# Patient Record
Sex: Male | Born: 1985 | Race: White | Hispanic: No | Marital: Single | Smoking: Current every day smoker
Health system: Southern US, Community
[De-identification: ages and names within clinical notes are randomized; demographics above are authoritative.]

## PROBLEM LIST (undated history)

## (undated) DIAGNOSIS — Z789 Other specified health status: Secondary | ICD-10-CM

## (undated) DIAGNOSIS — W3400XA Accidental discharge from unspecified firearms or gun, initial encounter: Secondary | ICD-10-CM

---
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
## 2004-11-13 ENCOUNTER — Emergency Department (HOSPITAL_COMMUNITY): Admission: EM | Admit: 2004-11-13 | Discharge: 2004-11-13 | Payer: Self-pay | Admitting: Family Medicine

## 2005-03-22 ENCOUNTER — Emergency Department (HOSPITAL_COMMUNITY): Admission: EM | Admit: 2005-03-22 | Discharge: 2005-03-22 | Payer: Self-pay | Admitting: *Deleted

## 2006-04-30 ENCOUNTER — Emergency Department (HOSPITAL_COMMUNITY): Admission: EM | Admit: 2006-04-30 | Discharge: 2006-05-01 | Payer: Self-pay | Admitting: Emergency Medicine

## 2006-12-20 ENCOUNTER — Emergency Department (HOSPITAL_COMMUNITY): Admission: EM | Admit: 2006-12-20 | Discharge: 2006-12-20 | Payer: Self-pay | Admitting: Emergency Medicine

## 2006-12-25 ENCOUNTER — Ambulatory Visit: Payer: Self-pay | Admitting: *Deleted

## 2006-12-25 ENCOUNTER — Emergency Department (HOSPITAL_COMMUNITY): Admission: EM | Admit: 2006-12-25 | Discharge: 2006-12-25 | Payer: Self-pay | Admitting: Emergency Medicine

## 2006-12-25 ENCOUNTER — Inpatient Hospital Stay (HOSPITAL_COMMUNITY): Admission: AD | Admit: 2006-12-25 | Discharge: 2006-12-29 | Payer: Self-pay | Admitting: *Deleted

## 2007-03-17 ENCOUNTER — Emergency Department (HOSPITAL_COMMUNITY): Admission: EM | Admit: 2007-03-17 | Discharge: 2007-03-17 | Payer: Self-pay | Admitting: Emergency Medicine

## 2007-05-15 ENCOUNTER — Emergency Department (HOSPITAL_COMMUNITY): Admission: EM | Admit: 2007-05-15 | Discharge: 2007-05-15 | Payer: Self-pay | Admitting: Emergency Medicine

## 2007-06-09 ENCOUNTER — Emergency Department (HOSPITAL_COMMUNITY): Admission: EM | Admit: 2007-06-09 | Discharge: 2007-06-09 | Payer: Self-pay | Admitting: Emergency Medicine

## 2007-09-04 ENCOUNTER — Emergency Department (HOSPITAL_COMMUNITY): Admission: EM | Admit: 2007-09-04 | Discharge: 2007-09-04 | Payer: Self-pay | Admitting: Emergency Medicine

## 2007-11-27 ENCOUNTER — Emergency Department (HOSPITAL_COMMUNITY): Admission: EM | Admit: 2007-11-27 | Discharge: 2007-11-27 | Payer: Self-pay | Admitting: Emergency Medicine

## 2008-01-04 ENCOUNTER — Emergency Department (HOSPITAL_COMMUNITY): Admission: EM | Admit: 2008-01-04 | Discharge: 2008-01-04 | Payer: Self-pay | Admitting: Emergency Medicine

## 2008-01-07 ENCOUNTER — Emergency Department (HOSPITAL_COMMUNITY): Admission: EM | Admit: 2008-01-07 | Discharge: 2008-01-07 | Payer: Self-pay | Admitting: Internal Medicine

## 2008-02-05 ENCOUNTER — Emergency Department (HOSPITAL_COMMUNITY): Admission: EM | Admit: 2008-02-05 | Discharge: 2008-02-05 | Payer: Self-pay | Admitting: Emergency Medicine

## 2008-03-03 ENCOUNTER — Emergency Department (HOSPITAL_COMMUNITY): Admission: EM | Admit: 2008-03-03 | Discharge: 2008-03-03 | Payer: Self-pay | Admitting: Emergency Medicine

## 2008-03-30 ENCOUNTER — Emergency Department (HOSPITAL_COMMUNITY): Admission: EM | Admit: 2008-03-30 | Discharge: 2008-03-30 | Payer: Self-pay | Admitting: Family Medicine

## 2009-01-26 ENCOUNTER — Emergency Department (HOSPITAL_COMMUNITY): Admission: EM | Admit: 2009-01-26 | Discharge: 2009-01-26 | Payer: Self-pay | Admitting: Family Medicine

## 2009-02-25 ENCOUNTER — Emergency Department (HOSPITAL_COMMUNITY): Admission: EM | Admit: 2009-02-25 | Discharge: 2009-02-25 | Payer: Self-pay | Admitting: Emergency Medicine

## 2011-03-04 NOTE — H&P (Signed)
Gregory Hooper, Gregory Hooper             ACCOUNT NO.:  [id number]   MEDICAL RECORD NO.:  [id number]          PATIENT TYPE:  IPS   LOCATION:  0407                          FACILITY:  [facility]   PHYSICIAN:  Jasmine Pang, M.D. DATE OF BIRTH:  05-28-1986   DATE OF ADMISSION:  12/25/2006  DATE OF DISCHARGE:                       PSYCHIATRIC ADMISSION ASSESSMENT   HISTORY OF PRESENT ILLNESS:  The patient is here on petition.  Paper  said the patient was pointing a gun to his head.  The patient states  that he wanted to kill himself. He states he has lost everything, his  girlfriend, his car, his job and his tools.  He has been smoking crack  cocaine.  He  has a history of cocaine use for approximately 4 years.  Was in a rehab program for approximately 2 months.  Relapsed about a  month later.  He also has been drinking, a drink primarily on the  weekends with an unspecified amount but states he has been drinking a  lot.  He has lost about 5-10 pounds after he was in the rehab program.  He is endorsing mood swings and racing thoughts.  He states he has a  bad temper and is stating he just does not want to be here although  he does promise safety on the unit.  He feels he is bipolar and he feels  he needs to be on medication.   PAST PSYCHIATRIC HISTORY:  This is his first admission to the [facility].  He is currently sponsored.  His longest history of  sobriety from cocaine has been one month.  Again was in a rehab program  in [place], [place] for approximately 2 months.   SOCIAL HISTORY:  He is a 25 year old single white male.  He lives with  his uncle and his mother. He has an eighth grade education.  Currently  unemployed.  Denies any legal problems.   FAMILY HISTORY:  Bipolar with his mother.  She is on medications.  The  patient is uncertain of what they are.   ALCOHOL AND DRUG HISTORY:  The patient smokes.  Alcohol and drinking  habits as described above.  He denies  any seizure activity.  Denies any  drinking in the morning or blackouts.   PRIMARY CARE Ilyana Manuele:  He has none.   MEDICAL PROBLEMS:  He denies any acute or chronic health issues.   MEDICATIONS:  No medications.   DRUG ALLERGIES:  No known allergies.   The patient was fully assessed at [facility] emergency department.   His temperature is 98.4, 105 heart rate, 20 respirations, blood pressure  132/84, 97% saturated.   LABORATORY DATA:  CBC within normal limits.  B-met within normal limits.  Alcohol level was 184.  Urine drug screen is positive for cocaine and  positive for THC.  He had a normal EKG.   MENTAL STATUS EXAM:  He is in the bed.  He is awake, staring ahead.  Currently wearing hospital scrubs.  SPEECH:  The patient initially provided only clipped yes or no answers.  Does not elaborate.  MOOD:  He is angry.  He is depressed.  The  patient's affect is flat and sad.  He does apologize for his attitude.  Thought processes are coherent.  No evidence of psychosis.  __________  intact.  Memory is good.  Judgment is fair.  Insight is fair.   AXIS I:  Rule out bipolar disorder, polysubstance abuse.  AXIS II:  Deferred.  AXIS III.  No acute or chronic health issues.  AXIS IV:  Problems with education, occupation, other psychosocial  problems.  AXIS V:  Current is 35.   PLAN:  Contract for safety.  We will stabilize mood and work on relapse  prevention.  Will have Librium available on a p.r.n. basis for  withdrawal symptoms.  Put nicotine patch on for tobacco use.  We will  initiate Depakote as mood stabilization and have Seroquel for  irritability agitation.  Will also get a liver function test and a TSH.  Consider a family session with his mother.  Case manager is to identify  any further rehab programs and follow-up.  Medication compliance will be  reinforced.  The patient is to increase his coping skills.  Tentative  length of stay is 4-5 days.      Landry Corporal,  N.P.      Jasmine Pang, M.D.  Electronically Signed    JO/MEDQ  D:  12/25/2006  T:  12/26/2006  Job:  [id number]

## 2011-03-04 NOTE — Discharge Summary (Signed)
Gregory Hooper, BAIL NO.:  [id number]   MEDICAL RECORD NO.:  [id number]          PATIENT TYPE:  IPS   LOCATION:  0407                          FACILITY:  [facility]   PHYSICIAN:  Jasmine Pang, M.D. DATE OF BIRTH:  Sep 25, 1986   DATE OF ADMISSION:  12/25/2006  DATE OF DISCHARGE:  12/29/2006                               DISCHARGE SUMMARY   IDENTIFICATION:  The patient is a 25 year old single white male who  lives with his uncle and his mother.  He was admitted on an involuntary  petition on December 25, 2006.   HISTORY OF PRESENT ILLNESS:  The petition paper said the patient was  pointing a gun to his head.  The patient states he wanted to kill  myself.  He states he has lost everything including his girlfriend, his  car, his job and his tools.  He has been smoking crack cocaine.  He has  had a history of cocaine use for approximately four years.  He was in a  rehab program for approximately two months.  He relapsed about a month  later.  He has been drinking a drink primarily on the weekends with an  unspecified amount but states he has been drinking a lot.  He has lost  about 5-10 pounds after he was in the rehab program.  He is endorsing  mood swings and racing thoughts.  He states he has a bad temper and is  stating he does not want to be here.  He does promise safety on the  unit, however.  He feels he is bipolar and needs to be on medication.  This is the first admission to the [facility] for this  patient.  He is currently sponsored by the mental health center.  His  longest history of sobriety from cocaine has been one month.  Again, he  was in rehab program in [place], [place] approximately two months  ago.  He has a family history of bipolar disorder (mother).  She is on  medications.  He has no acute or chronic health issues.  He is on no  medications.  He has no known drug allergies.   PHYSICAL EXAMINATION:  The patient was fully  assessed at [facility]  Emergency Department.  He was in no acute medical distress and was  physically healthy.   LABORATORY DATA:  CBC was within normal limits.  BMET was within normal  limits.  Alcohol level was 184.  Urine drug screen was positive for  cocaine and THC.  He had a normal EKG.  Hepatic profile was within  normal limits except for a slightly decreased total protein of 5.8.  TSH  was 1.989 which was within normal limits.   HOSPITAL COURSE:  Upon admission, the patient was placed on Librium 25  mg p.o. q.6h. p.r.n. withdrawal.  On December 25, 2006, the patient was  placed on a Nicoderm patch 21 mg as per smoking cessation protocol.  On  December 25, 2006, the patient was started on Depakote ER 250 mg p.o. now,  then Depakote ER 500 mg p.o.  q.h.s.  He was also started on Seroquel 50  mg p.o. q.6h. p.r.n. agitation.  On December 27, 2006, the patient was  started on Seroquel 50 mg p.o. b.i.d. and 100 mg p.o. q.h.s. as a  standing dose.  On December 28, 2006, an a.m. Depakote level was obtained  and was 32.5 (50-100).  Depakote ER was increased to 1000 mg p.o. q.h.s.  On December 28, 2006, due to daytime sedation from the Seroquel, it was  decreased to 25 mg p.o. b.i.d. and 100 mg p.o. q.h.s.  On December 29, 2006, due to continue daytime sedation, Seroquel was decreased to 150 mg  p.o. q.h.s.  On December 29, 2006, the patient was complaining of GERD-like  symptoms and was placed on Protonix 40 mg now, then p.o. q.d.  The  patient tolerated his medications well.  The only major side effect was  sedation during the day with the Seroquel and this was eventually all  changed to h.s.   On December 26, 2006, the patient discussed being here because he was going  to kill himself due to his girlfriend's problems.  He complained of  hearing voices and was requesting meds for this.  The p.r.n. Seroquel  was started.  Mood was anxious and he was somewhat irritable.  There was  no suicidal or homicidal  ideation.  He states he slept well and his  appetite was fair.  On December 27, 2006, sleep was good and appetite was  good.  The patient was not sure whether the Seroquel helps but he did  feel somewhat calmer on it.  The staff notes that he seems to do better  after getting p.r.n. dose of Seroquel.  Therefore, Seroquel 50 mg p.o.  b.i.d. and 100 mg p.o. q.h.s. was started on a standing basis.  He  stated that it had been hard for him to talk to people on the unit  because he is very shy.  He discussed living with his mother and his  uncle.  He states he gets along well with his uncle but not his mother.  He became more irritable with me as we talked.  Depakote ER 500 mg p.o.  q.h.s. was continued.  On December 28, 2006, Depakote ER was increased to  1000 mg p.o. q.h.s.  The patient stated he felt ready to go home soon.  He was still having a problem with his attitude flaring up.  He stated  he was craving nicotine and felt this was impacting on his irritability.  He was more pleasant today.  He wanted discharge tomorrow and it was  felt he would be ready for this.  He still wants a rehab treatment  program and will arrange this once he is back home with his mother and  uncle.   On December 29, 2006, mental status had improved markedly from admission.  The patient was less depressed.  The patient was cooperative, friendly  and conversant.  He had good eye contact.  Speech was normal rate and  flow.  Psychomotor activity was within normal limits.  Mood was  euthymic.  Affect wide range.  There was no suicidal or homicidal  ideation.  No thoughts of self-injurious behavior.  No auditory or  visual hallucinations.  No paranoia or delusions.  Thoughts were logical  and goal-directed.  Thought content no predominant theme.  Cognitive  exam was grossly within normal limits and back to baseline.  It was felt the patient was stable  enough to be discharged back to his mother and  uncle's home today.    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  Polysubstance abuse.  AXIS II:  None.  AXIS III:  No acute or chronic health problems.  AXIS IV:  Severe (problems with education, occupation, primary social  group, other psychosocial problems, burden of psychiatric illness).  AXIS V:  GAF upon discharge 50; GAF upon admission 35; GAF highest past  year 60-65.   ACTIVITY/DIET:  The patient had no specific activity level or dietary  restrictions.   POST-HOSPITAL CARE PLANS:  The patient will be seen at the [facility] by Dr. Lang Snow on Thursday, January 04, 2007 at 10 a.m.   DISCHARGE MEDICATIONS:  1. Depakote ER 1000 mg p.o. q.h.s.  2. Seroquel 150 mg p.o. q.h.s.  3. Protonix 40 mg, 1 p.o. q.d.   A.m. Depakote level on December 28, 2006 was 32.5 (50-100).  This was on  500 mg ER q.h.s.  The dose was increased to 1000 mg p.o. q.h.s.  afterwards.      Jasmine Pang, M.D.  Electronically Signed     BHS/MEDQ  D:  12/29/2006  T:  12/29/2006  Job:  [id number]

## 2011-03-04 NOTE — Op Note (Signed)
[facility]  Patient:    Gregory Hooper, Gregory Hooper Visit Number: [id number] MRN: [id number]          Service Type: DSU Location: [facility] Attending Physician:  Ronne Binning Dictated by:   Nicki Reaper, M.D. Proc. Date: 11/09/01 Admit Date:  11/09/2001                             Operative Report  PREOPERATIVE DIAGNOSIS:  Laceration left ring finger.  POSTOPERATIVE DIAGNOSIS:  Laceration left ring finger.  PROCEDURE:  Exploration and repair digital artery and nerve ulnar side, left ring finger.  Repair partial flexor tendon laceration, flexor sheath.  SURGEON:  Nicki Reaper, M.D.  ANESTHESIA:  General.  ANESTHESIOLOGIST:  Janetta Hora. Gelene Mink, M.D.  HISTORY:  The patient is a 25 year old male who suffered a laceration of his left ring finger.  He complains of numbness and tingling, pain with resistive flexion.  PROCEDURE:  The patient was brought to the operating room where a general anesthetic was carried out without difficulty.  He was prepped and draped using duraprep with the left arm free.  The limb was exsanguinated with an Esmarch bandage, tourniquet placed high and the arm was inflated to 250 mmHg. The wound was extended proximally and distally after removal of the sutures. The laceration to the flexor tendon sheath was identified along with the laceration to the digital artery and nerve on the ulnar side.  These were complete. The sheath was opened.  A less than 10% laceration in the superficialis tendon was identified.  The sheath was then closed after irrigation with a running 6-0 Mersilene suture.  The finger was placed through a full range of motion to be certain that there was no adherence to the tendons beneath the repair.  The tendons moved freely.  The operative microscope was then brought into position.  The ends of the artery cut back to normal intimi, irrigated, and dilated.  A repair was then performed with backwall  first technique with interrupted 9-0 nylon sutures. The ends of the nerve were freshened.  An epineural repair aligning vesicles as much as possible.  Again, the repair was preformed with interrupted 9-0 nylon sutures. The wound was irrigated.  The skin was closed with interrupted 5-0 nylon sutures.  Sterile compression dressing and splint was applied.  The patient tolerated the procedure well and was taken to the recovery room for observation in satisfactory condition.  It should be noted that the laceration was just distal to the web.  The patient tolerated the procedure well.  He was discharged home to return to the [facility] in 1 week on Vicodin and Keflex. Dictated by:   Nicki Reaper, M.D. Attending Physician:  Ronne Binning DD:  11/09/01 TD:  11/09/01 Job: [id number]

## 2011-07-11 LAB — CBC
Hemoglobin: 15.4
MCHC: 33.9
MCV: 93.3
RDW: 12.6

## 2011-07-11 LAB — BASIC METABOLIC PANEL
BUN: 7
CO2: 29
Calcium: 9.5
Chloride: 103
Creatinine, Ser: 0.88
GFR calc Af Amer: >60
GFR calc non Af Amer: >60
Glucose, Bld: 89
Potassium: 3.3 — ABNORMAL LOW
Sodium: 141

## 2011-07-11 LAB — DIFFERENTIAL
Basophils Absolute: 0
Basophils Relative: 0
Eosinophils Absolute: 0
Monocytes Absolute: 0.6
Neutro Abs: 6.6
Neutrophils Relative %: 72

## 2012-09-12 ENCOUNTER — Emergency Department (HOSPITAL_COMMUNITY): Payer: Self-pay

## 2012-09-12 ENCOUNTER — Emergency Department (HOSPITAL_COMMUNITY)
Admission: EM | Admit: 2012-09-12 | Discharge: 2012-09-12 | Disposition: A | Discharge status: Home / Self Care | Payer: Self-pay | Attending: Emergency Medicine | Admitting: Emergency Medicine

## 2012-09-12 ENCOUNTER — Encounter (HOSPITAL_COMMUNITY): Payer: Self-pay | Admitting: Emergency Medicine

## 2012-09-12 DIAGNOSIS — S46912A Strain of unspecified muscle, fascia and tendon at shoulder and upper arm level, left arm, initial encounter: Secondary | ICD-10-CM

## 2012-09-12 DIAGNOSIS — Y9289 Other specified places as the place of occurrence of the external cause: Secondary | ICD-10-CM | POA: Insufficient documentation

## 2012-09-12 DIAGNOSIS — Y9389 Activity, other specified: Secondary | ICD-10-CM | POA: Insufficient documentation

## 2012-09-12 DIAGNOSIS — IMO0002 Reserved for concepts with insufficient information to code with codable children: Secondary | ICD-10-CM | POA: Insufficient documentation

## 2012-09-12 DIAGNOSIS — F17200 Nicotine dependence, unspecified, uncomplicated: Secondary | ICD-10-CM | POA: Insufficient documentation

## 2012-09-12 DIAGNOSIS — X500XXA Overexertion from strenuous movement or load, initial encounter: Secondary | ICD-10-CM | POA: Insufficient documentation

## 2012-09-12 MED ORDER — HYDROCODONE-ACETAMINOPHEN 5-325 MG PO TABS: 1.0000 | ORAL_TABLET | ORAL | Status: DC | PRN

## 2012-09-12 MED ORDER — IBUPROFEN 800 MG PO TABS: 800.0000 mg | ORAL_TABLET | Freq: Three times a day (TID) | ORAL | Status: DC

## 2012-09-12 NOTE — ED Notes (Signed)
Patient states he was doing yard work the day before and woke up with left shoulder and arm pain.  Patient c/o pain with palpation.  Movement worsens the pain.

## 2012-09-12 NOTE — ED Provider Notes (Signed)
History     CSN: [id number]  Arrival date & time 09/12/12  2008   First MD Initiated Contact with Patient 09/12/12 2126      Chief Complaint  Patient presents with  . Shoulder Pain    (Consider location/radiation/quality/duration/timing/severity/associated sxs/prior treatment) Patient is a 26 y.o. male presenting with shoulder pain. The history is provided by the patient.  Shoulder Pain This is a new problem. The current episode started yesterday. The problem occurs constantly. Pertinent negatives include no chills or fever. Associated symptoms comments: He has had pain in the left shoulder since yardwork 2 days ago. No direct trauma. No neck pain.Marland Kitchen    No past medical history on file.  No past surgical history on file.  No family history on file.  History  Substance Use Topics  . Smoking status: Current Every Day Smoker -- 1.0 packs/day    Types: Cigarettes  . Smokeless tobacco: Not on file  . Alcohol Use: No      Review of Systems  Constitutional: Negative for fever and chills.  HENT: Negative.   Respiratory: Negative.   Cardiovascular: Negative.   Gastrointestinal: Negative.   Musculoskeletal: Negative.        See HPI  Skin: Negative.   Neurological: Negative.     Allergies  Review of patient's allergies indicates no known allergies.  Home Medications  No current outpatient prescriptions on file.  BP 134/76  Pulse 78  Temp 98.4 F (36.9 C)  Resp 18  Ht 5\' 5"  (1.651 m)  Wt 130 lb (58.968 kg)  BMI 21.63 kg/m2  SpO2 97%  Physical Exam  Constitutional: He is oriented to person, place, and time. He appears well-developed and well-nourished.  Neck: Normal range of motion.  Cardiovascular:       Pulses in distal UE's present and equal.   Pulmonary/Chest: Effort normal.  Musculoskeletal: Normal range of motion.       Left shoulder unremarkable in appearance. No swelling or bony deformity. FROM with pain on full extension of shoulder. 5/5 strength in  UE. No cervical or paracervical tenderness.   Neurological: He is alert and oriented to person, place, and time.  Skin: Skin is warm and dry. No erythema.  Psychiatric: He has a normal mood and affect.    ED Course  Procedures (including critical care time)  Labs Reviewed - No data to display Dg Shoulder Left  09/12/2012  *RADIOLOGY REPORT*  Clinical Data: Left shoulder pain.  LEFT SHOULDER - 2+ VIEW  Comparison: Chest x-ray 03/22/2005.  Findings: Three views of the left shoulder demonstrate no acute displaced fracture, subluxation or dislocation.  IMPRESSION: No acute abnormality of the left shoulder.   Original Report Authenticated By: Trudie Reed, M.D.      No diagnosis found.  1. Left shoulder strain  MDM  Uncomplicated shoulder strain.        Rodena Medin, PA-C 09/12/12 2314

## 2012-09-13 NOTE — ED Provider Notes (Signed)
Medical screening examination/treatment/procedure(s) were performed by non-physician practitioner and as supervising physician I was immediately available for consultation/collaboration.  Amaryllis Malmquist, MD 09/13/12 0103 

## 2013-02-10 ENCOUNTER — Inpatient Hospital Stay (HOSPITAL_COMMUNITY)
Admission: EM | Admit: 2013-02-10 | Discharge: 2013-02-14 | DRG: 604 | Disposition: A | Discharge status: Home / Self Care | Payer: No Typology Code available for payment source | Attending: General Surgery | Admitting: General Surgery

## 2013-02-10 ENCOUNTER — Other Ambulatory Visit: Payer: Self-pay

## 2013-02-10 ENCOUNTER — Emergency Department (HOSPITAL_COMMUNITY): Payer: No Typology Code available for payment source

## 2013-02-10 ENCOUNTER — Inpatient Hospital Stay (HOSPITAL_COMMUNITY): Payer: No Typology Code available for payment source

## 2013-02-10 DIAGNOSIS — S02400B Malar fracture unspecified, initial encounter for open fracture: Secondary | ICD-10-CM | POA: Diagnosis present

## 2013-02-10 DIAGNOSIS — S025XXA Fracture of tooth (traumatic), initial encounter for closed fracture: Secondary | ICD-10-CM

## 2013-02-10 DIAGNOSIS — F1410 Cocaine abuse, uncomplicated: Secondary | ICD-10-CM | POA: Diagnosis present

## 2013-02-10 DIAGNOSIS — T1490XA Injury, unspecified, initial encounter: Secondary | ICD-10-CM | POA: Diagnosis present

## 2013-02-10 DIAGNOSIS — S0180XA Unspecified open wound of other part of head, initial encounter: Principal | ICD-10-CM | POA: Diagnosis present

## 2013-02-10 DIAGNOSIS — Y230XXA Shotgun discharge, undetermined intent, initial encounter: Secondary | ICD-10-CM

## 2013-02-10 DIAGNOSIS — R000 Tachycardia, unspecified: Secondary | ICD-10-CM | POA: Diagnosis present

## 2013-02-10 DIAGNOSIS — S21109A Unspecified open wound of unspecified front wall of thorax without penetration into thoracic cavity, initial encounter: Secondary | ICD-10-CM

## 2013-02-10 DIAGNOSIS — S2190XA Unspecified open wound of unspecified part of thorax, initial encounter: Secondary | ICD-10-CM | POA: Diagnosis present

## 2013-02-10 DIAGNOSIS — S0183XA Puncture wound without foreign body of other part of head, initial encounter: Secondary | ICD-10-CM

## 2013-02-10 DIAGNOSIS — D62 Acute posthemorrhagic anemia: Secondary | ICD-10-CM | POA: Diagnosis present

## 2013-02-10 DIAGNOSIS — R0902 Hypoxemia: Secondary | ICD-10-CM | POA: Diagnosis present

## 2013-02-10 DIAGNOSIS — S0242XB Fracture of alveolus of maxilla, initial encounter for open fracture: Secondary | ICD-10-CM

## 2013-02-10 DIAGNOSIS — S02401B Maxillary fracture, unspecified, initial encounter for open fracture: Secondary | ICD-10-CM | POA: Diagnosis present

## 2013-02-10 DIAGNOSIS — W3400XA Accidental discharge from unspecified firearms or gun, initial encounter: Secondary | ICD-10-CM

## 2013-02-10 DIAGNOSIS — S21339A Puncture wound without foreign body of unspecified front wall of thorax with penetration into thoracic cavity, initial encounter: Secondary | ICD-10-CM

## 2013-02-10 DIAGNOSIS — S272XXA Traumatic hemopneumothorax, initial encounter: Secondary | ICD-10-CM

## 2013-02-10 HISTORY — DX: Other specified health status: Z78.9

## 2013-02-10 LAB — COMPREHENSIVE METABOLIC PANEL
ALT: 18 U/L (ref 0–53)
AST: 16 U/L (ref 0–37)
Albumin: 2.8 g/dL — ABNORMAL LOW (ref 3.5–5.2)
Alkaline Phosphatase: 48 U/L (ref 39–117)
BUN: 7 mg/dL (ref 6–23)
Potassium: 3.3 mEq/L — ABNORMAL LOW (ref 3.5–5.1)
Sodium: 144 mEq/L (ref 135–145)
Total Protein: 5.1 g/dL — ABNORMAL LOW (ref 6.0–8.3)

## 2013-02-10 LAB — BASIC METABOLIC PANEL
Calcium: 7 mg/dL — ABNORMAL LOW (ref 8.4–10.5)
GFR calc Af Amer: >90 mL/min (ref >90)
GFR calc non Af Amer: >90 mL/min (ref >90)
Glucose, Bld: 167 mg/dL — ABNORMAL HIGH (ref 70–99)
Potassium: 4.8 mEq/L (ref 3.5–5.1)
Sodium: 144 mEq/L (ref 135–145)

## 2013-02-10 LAB — CG4 I-STAT (LACTIC ACID): Lactic Acid, Venous: 4.48 mmol/L — ABNORMAL HIGH (ref 0.5–2.2)

## 2013-02-10 LAB — URINALYSIS, MICROSCOPIC ONLY
Glucose, UA: NEGATIVE mg/dL
Hgb urine dipstick: NEGATIVE
Ketones, ur: NEGATIVE mg/dL
Leukocytes, UA: NEGATIVE
pH: 5 (ref 5.0–8.0)

## 2013-02-10 LAB — CBC
Hemoglobin: 7.8 g/dL — ABNORMAL LOW (ref 13.0–17.0)
MCH: 32 pg (ref 26.0–34.0)
MCHC: 35.8 g/dL (ref 30.0–36.0)
MCHC: 35.2 g/dL (ref 30.0–36.0)
MCHC: 35.5 g/dL (ref 30.0–36.0)
Platelets: 184 10*3/uL (ref 150–400)
Platelets: 167 10*3/uL (ref 150–400)
RBC: 2.48 MIL/uL — ABNORMAL LOW (ref 4.22–5.81)
RDW: 12.6 % (ref 11.5–15.5)
RDW: 12.7 % (ref 11.5–15.5)
WBC: 23.4 10*3/uL — ABNORMAL HIGH (ref 4.0–10.5)

## 2013-02-10 LAB — CBC WITH DIFFERENTIAL/PLATELET
Basophils Absolute: 0 10*3/uL (ref 0.0–0.1)
Eosinophils Absolute: 0.1 10*3/uL (ref 0.0–0.7)
Eosinophils Relative: 1 % (ref 0–5)
Lymphocytes Relative: 36 % (ref 12–46)
MCH: 31.7 pg (ref 26.0–34.0)
MCV: 91.2 fL (ref 78.0–100.0)
Platelets: 250 10*3/uL (ref 150–400)
RDW: 12.4 % (ref 11.5–15.5)
WBC: 6.4 10*3/uL (ref 4.0–10.5)

## 2013-02-10 LAB — ABO/RH: ABO/RH(D): A POS

## 2013-02-10 LAB — URINALYSIS, ROUTINE W REFLEX MICROSCOPIC
Ketones, ur: NEGATIVE mg/dL
Leukocytes, UA: NEGATIVE
Nitrite: NEGATIVE
Protein, ur: NEGATIVE mg/dL
Urobilinogen, UA: 0.2 mg/dL (ref 0.0–1.0)
pH: 5 (ref 5.0–8.0)

## 2013-02-10 LAB — GLUCOSE, CAPILLARY
Glucose-Capillary: 141 mg/dL — ABNORMAL HIGH (ref 70–99)
Glucose-Capillary: 158 mg/dL — ABNORMAL HIGH (ref 70–99)

## 2013-02-10 LAB — PROTIME-INR
INR: 1.39 (ref 0.00–1.49)
Prothrombin Time: 16.7 seconds — ABNORMAL HIGH (ref 11.6–15.2)

## 2013-02-10 LAB — POCT I-STAT, CHEM 8
Calcium, Ion: 1.06 mmol/L — ABNORMAL LOW (ref 1.12–1.23)
HCT: 33 % — ABNORMAL LOW (ref 39.0–52.0)
Hemoglobin: 11.2 g/dL — ABNORMAL LOW (ref 13.0–17.0)
Sodium: 148 mEq/L — ABNORMAL HIGH (ref 135–145)
TCO2: 20 mmol/L (ref 0–100)

## 2013-02-10 LAB — CDS SEROLOGY

## 2013-02-10 LAB — MRSA PCR SCREENING: MRSA by PCR: NEGATIVE

## 2013-02-10 LAB — URINE MICROSCOPIC-ADD ON

## 2013-02-10 MED ORDER — ONDANSETRON HCL 4 MG PO TABS
4.0000 mg | ORAL_TABLET | Freq: Four times a day (QID) | ORAL | Status: DC | PRN
  Administered 2013-02-11 – 2013-02-12 (×3): 4 mg via ORAL
  Filled 2013-02-10 (×4): qty 1

## 2013-02-10 MED ORDER — HYDROMORPHONE HCL PF 2 MG/ML IJ SOLN
4.0000 mg | Freq: Once | INTRAMUSCULAR | Status: AC
  Administered 2013-02-10: 4 mg via INTRAVENOUS

## 2013-02-10 MED ORDER — FENTANYL CITRATE 0.05 MG/ML IJ SOLN
100.0000 ug | Freq: Once | INTRAMUSCULAR | Status: AC
  Administered 2013-02-10: 100 ug via INTRAVENOUS

## 2013-02-10 MED ORDER — DIPHENHYDRAMINE HCL 50 MG/ML IJ SOLN
12.5000 mg | Freq: Four times a day (QID) | INTRAMUSCULAR | Status: DC | PRN
  Administered 2013-02-10: 12.5 mg via INTRAVENOUS
  Filled 2013-02-10: qty 1

## 2013-02-10 MED ORDER — HYDROMORPHONE HCL PF 1 MG/ML IJ SOLN
1.0000 mg | Freq: Once | INTRAMUSCULAR | Status: AC
  Administered 2013-02-10: 1 mg via INTRAVENOUS
  Filled 2013-02-10: qty 1

## 2013-02-10 MED ORDER — SODIUM CHLORIDE 0.9 % IJ SOLN: 9.0000 mL | INTRAMUSCULAR | Status: DC | PRN

## 2013-02-10 MED ORDER — PANTOPRAZOLE SODIUM 40 MG IV SOLR
40.0000 mg | Freq: Every day | INTRAVENOUS | Status: DC
  Administered 2013-02-10 – 2013-02-12 (×2): 40 mg via INTRAVENOUS
  Filled 2013-02-10 (×4): qty 40

## 2013-02-10 MED ORDER — WHITE PETROLATUM GEL
Status: AC
  Administered 2013-02-10: 09:00:00
  Filled 2013-02-10: qty 5

## 2013-02-10 MED ORDER — NALOXONE HCL 0.4 MG/ML IJ SOLN: 0.4000 mg | INTRAMUSCULAR | Status: DC | PRN

## 2013-02-10 MED ORDER — HYDROMORPHONE HCL PF 1 MG/ML IJ SOLN
1.0000 mg | INTRAMUSCULAR | Status: DC | PRN
  Administered 2013-02-10: 1 mg via INTRAVENOUS
  Filled 2013-02-10: qty 1

## 2013-02-10 MED ORDER — TETANUS-DIPHTH-ACELL PERTUSSIS 5-2.5-18.5 LF-MCG/0.5 IM SUSP
0.5000 mL | Freq: Once | INTRAMUSCULAR | Status: AC
  Administered 2013-02-10: 0.5 mL via INTRAMUSCULAR

## 2013-02-10 MED ORDER — ONDANSETRON HCL 4 MG/2ML IJ SOLN
4.0000 mg | Freq: Four times a day (QID) | INTRAMUSCULAR | Status: DC | PRN
  Administered 2013-02-11: 4 mg via INTRAVENOUS

## 2013-02-10 MED ORDER — IOHEXOL 350 MG/ML SOLN
100.0000 mL | Freq: Once | INTRAVENOUS | Status: AC | PRN
  Administered 2013-02-10: 100 mL via INTRAVENOUS

## 2013-02-10 MED ORDER — IPRATROPIUM-ALBUTEROL 20-100 MCG/ACT IN AERS
2.0000 | INHALATION_SPRAY | Freq: Four times a day (QID) | RESPIRATORY_TRACT | Status: DC
  Administered 2013-02-10 – 2013-02-11 (×6): 2 via RESPIRATORY_TRACT
  Filled 2013-02-10: qty 4

## 2013-02-10 MED ORDER — HYDROMORPHONE 0.3 MG/ML IV SOLN
INTRAVENOUS | Status: DC
  Administered 2013-02-10: 0.3 mg via INTRAVENOUS
  Administered 2013-02-10: 18:00:00 via INTRAVENOUS
  Administered 2013-02-11: 3 mg via INTRAVENOUS
  Administered 2013-02-11: 3.6 mg via INTRAVENOUS
  Administered 2013-02-11: 17:00:00 via INTRAVENOUS
  Administered 2013-02-11: 0.3 mg via INTRAVENOUS
  Administered 2013-02-12: 05:00:00 via INTRAVENOUS
  Administered 2013-02-12: 2.7 mg via INTRAVENOUS
  Administered 2013-02-12: 0.9 mg via INTRAVENOUS
  Filled 2013-02-10 (×5): qty 25

## 2013-02-10 MED ORDER — HYDROMORPHONE HCL PF 2 MG/ML IJ SOLN
INTRAMUSCULAR | Status: AC
  Filled 2013-02-10: qty 2

## 2013-02-10 MED ORDER — BIOTENE DRY MOUTH MT LIQD
15.0000 mL | Freq: Three times a day (TID) | OROMUCOSAL | Status: DC
  Administered 2013-02-10 – 2013-02-14 (×11): 15 mL via OROMUCOSAL

## 2013-02-10 MED ORDER — PANTOPRAZOLE SODIUM 40 MG PO TBEC
40.0000 mg | DELAYED_RELEASE_TABLET | Freq: Every day | ORAL | Status: DC
  Administered 2013-02-11: 40 mg via ORAL
  Filled 2013-02-10 (×2): qty 1

## 2013-02-10 MED ORDER — DEXTROSE IN LACTATED RINGERS 5 % IV SOLN
INTRAVENOUS | Status: DC
  Administered 2013-02-10 – 2013-02-12 (×7): via INTRAVENOUS

## 2013-02-10 MED ORDER — ONDANSETRON HCL 4 MG/2ML IJ SOLN
4.0000 mg | Freq: Four times a day (QID) | INTRAMUSCULAR | Status: DC | PRN
  Administered 2013-02-10: 4 mg via INTRAVENOUS
  Filled 2013-02-10: qty 2

## 2013-02-10 MED ORDER — DIPHENHYDRAMINE HCL 12.5 MG/5ML PO ELIX
12.5000 mg | ORAL_SOLUTION | Freq: Four times a day (QID) | ORAL | Status: DC | PRN
  Filled 2013-02-10: qty 5

## 2013-02-10 MED ORDER — CEFAZOLIN SODIUM 1-5 GM-% IV SOLN
1.0000 g | Freq: Once | INTRAVENOUS | Status: AC
  Administered 2013-02-10: 1 g via INTRAVENOUS
  Filled 2013-02-10: qty 50
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
## 2013-02-10 NOTE — ED Notes (Signed)
Blood and OR consent signed and witnessed by Brantley Persons

## 2013-02-10 NOTE — ED Notes (Signed)
To CT
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
## 2013-02-10 NOTE — ED Notes (Signed)
Pt continues to be alert and oriented x's 3.
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
## 2013-02-10 NOTE — ED Notes (Signed)
Returned from CT, tolerated scan without any problems.  Pt continues to c/o back pain.  Pt remains alert and oriented x's 3.

## 2013-02-10 NOTE — ED Notes (Signed)
PT clothing bagged/labeled and given to [facility]

## 2013-02-10 NOTE — ED Notes (Signed)
Per patient's request, phoned mother Lynden Oxford @ [phone]) and uncle Jolaine Click @ [phone]). Both are on the way.
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
## 2013-02-11 ENCOUNTER — Inpatient Hospital Stay (HOSPITAL_COMMUNITY): Payer: No Typology Code available for payment source

## 2013-02-11 DIAGNOSIS — D62 Acute posthemorrhagic anemia: Secondary | ICD-10-CM

## 2013-02-11 DIAGNOSIS — S271XXA Traumatic hemothorax, initial encounter: Secondary | ICD-10-CM

## 2013-02-11 LAB — CBC
HCT: 18.9 % — ABNORMAL LOW (ref 39.0–52.0)
MCH: 31.3 pg (ref 26.0–34.0)
MCHC: 36 g/dL (ref 30.0–36.0)
MCV: 88.7 fL (ref 78.0–100.0)
Platelets: 133 10*3/uL — ABNORMAL LOW (ref 150–400)
Platelets: 146 10*3/uL — ABNORMAL LOW (ref 150–400)
RBC: 2.13 MIL/uL — ABNORMAL LOW (ref 4.22–5.81)
RBC: 3.16 MIL/uL — ABNORMAL LOW (ref 4.22–5.81)
RDW: 12.6 % (ref 11.5–15.5)
WBC: 12.4 10*3/uL — ABNORMAL HIGH (ref 4.0–10.5)

## 2013-02-11 LAB — URINE CULTURE

## 2013-02-11 LAB — PREPARE RBC (CROSSMATCH)

## 2013-02-11 LAB — BASIC METABOLIC PANEL
CO2: 30 mEq/L (ref 19–32)
Chloride: 103 mEq/L (ref 96–112)
Creatinine, Ser: 0.74 mg/dL (ref 0.50–1.35)
GFR calc Af Amer: >90 mL/min (ref >90)
Potassium: 3.5 mEq/L (ref 3.5–5.1)

## 2013-02-11 MED ORDER — CEFAZOLIN SODIUM 1-5 GM-% IV SOLN
1.0000 g | Freq: Three times a day (TID) | INTRAVENOUS | Status: DC
  Administered 2013-02-11 – 2013-02-13 (×6): 1 g via INTRAVENOUS
  Filled 2013-02-11 (×9): qty 50
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
## 2013-02-11 NOTE — Progress Notes (Signed)
CRITICAL VALUE ALERT  Critical value received:  Hgb 6.7  Date of notification:  02/11/2013  Time of notification:  0316  Critical value read back:yes  Nurse who received alert:  Gerre Pebbles, RN/Chris Madilyn Fireman  MD notified (1st page):  Dr Janee Morn  Time of first page:  0320  MD notified (2nd page):  Time of second page:  Responding MD:  Dr Janee Morn  Time MD responded:  [phone]

## 2013-02-11 NOTE — Progress Notes (Signed)
UR completed 

## 2013-02-12 ENCOUNTER — Encounter (HOSPITAL_COMMUNITY): Payer: Self-pay | Admitting: *Deleted

## 2013-02-12 ENCOUNTER — Inpatient Hospital Stay (HOSPITAL_COMMUNITY): Payer: No Typology Code available for payment source

## 2013-02-12 DIAGNOSIS — S0183XA Puncture wound without foreign body of other part of head, initial encounter: Secondary | ICD-10-CM

## 2013-02-12 DIAGNOSIS — S21339A Puncture wound without foreign body of unspecified front wall of thorax with penetration into thoracic cavity, initial encounter: Secondary | ICD-10-CM

## 2013-02-12 DIAGNOSIS — S272XXA Traumatic hemopneumothorax, initial encounter: Secondary | ICD-10-CM

## 2013-02-12 DIAGNOSIS — S025XXA Fracture of tooth (traumatic), initial encounter for closed fracture: Secondary | ICD-10-CM

## 2013-02-12 DIAGNOSIS — D62 Acute posthemorrhagic anemia: Secondary | ICD-10-CM | POA: Diagnosis present

## 2013-02-12 DIAGNOSIS — S0242XB Fracture of alveolus of maxilla, initial encounter for open fracture: Secondary | ICD-10-CM

## 2013-02-12 DIAGNOSIS — W3400XA Accidental discharge from unspecified firearms or gun, initial encounter: Secondary | ICD-10-CM

## 2013-02-12 LAB — BASIC METABOLIC PANEL
GFR calc Af Amer: >90 mL/min (ref >90)
GFR calc non Af Amer: >90 mL/min (ref >90)
Potassium: 3.5 mEq/L (ref 3.5–5.1)
Sodium: 136 mEq/L (ref 135–145)

## 2013-02-12 LAB — DIFFERENTIAL
Basophils Absolute: 0 10*3/uL (ref 0.0–0.1)
Lymphocytes Relative: 15 % (ref 12–46)
Neutro Abs: 9.2 10*3/uL — ABNORMAL HIGH (ref 1.7–7.7)
Neutrophils Relative %: 74 % (ref 43–77)

## 2013-02-12 LAB — TYPE AND SCREEN
Antibody Screen: NEGATIVE
Unit division: 0
Unit division: 0

## 2013-02-12 LAB — CBC
Hemoglobin: 9.4 g/dL — ABNORMAL LOW (ref 13.0–17.0)
MCHC: 35.9 g/dL (ref 30.0–36.0)
RDW: 13.2 % (ref 11.5–15.5)

## 2013-02-12 LAB — HEMOGLOBIN AND HEMATOCRIT, BLOOD: Hemoglobin: 9.6 g/dL — ABNORMAL LOW (ref 13.0–17.0)

## 2013-02-12 MED ORDER — POTASSIUM CHLORIDE 10 MEQ/100ML IV SOLN: 10.0000 meq | INTRAVENOUS | Status: DC

## 2013-02-12 MED ORDER — OXYCODONE HCL 5 MG PO TABS
5.0000 mg | ORAL_TABLET | ORAL | Status: DC | PRN
  Administered 2013-02-12: 10 mg via ORAL
  Administered 2013-02-12: 5 mg via ORAL
  Administered 2013-02-12: 15 mg via ORAL
  Administered 2013-02-12: 5 mg via ORAL
  Administered 2013-02-12: 10 mg via ORAL
  Administered 2013-02-13: 15 mg via ORAL
  Administered 2013-02-13: 10 mg via ORAL
  Administered 2013-02-13: 15 mg via ORAL
  Administered 2013-02-13: 10 mg via ORAL
  Administered 2013-02-14: 15 mg via ORAL
  Filled 2013-02-12: qty 15
  Filled 2013-02-12: qty 1
  Filled 2013-02-12: qty 2
  Filled 2013-02-12: qty 3
  Filled 2013-02-12: qty 1
  Filled 2013-02-12: qty 2
  Filled 2013-02-12: qty 3
  Filled 2013-02-12: qty 2
  Filled 2013-02-12: qty 3
  Filled 2013-02-12: qty 2

## 2013-02-12 MED ORDER — POLYETHYLENE GLYCOL 3350 17 G PO PACK
17.0000 g | PACK | Freq: Every day | ORAL | Status: DC
  Administered 2013-02-12 – 2013-02-14 (×3): 17 g via ORAL
  Filled 2013-02-12 (×4): qty 1

## 2013-02-12 MED ORDER — HYDROMORPHONE HCL PF 1 MG/ML IJ SOLN
0.5000 mg | INTRAMUSCULAR | Status: DC | PRN
  Administered 2013-02-12 – 2013-02-14 (×6): 1 mg via INTRAVENOUS
  Filled 2013-02-12 (×6): qty 1

## 2013-02-12 MED ORDER — IPRATROPIUM-ALBUTEROL 20-100 MCG/ACT IN AERS
2.0000 | INHALATION_SPRAY | Freq: Two times a day (BID) | RESPIRATORY_TRACT | Status: DC
  Administered 2013-02-12 – 2013-02-13 (×4): 2 via RESPIRATORY_TRACT
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
## 2013-02-13 ENCOUNTER — Inpatient Hospital Stay (HOSPITAL_COMMUNITY): Payer: No Typology Code available for payment source

## 2013-02-13 LAB — BASIC METABOLIC PANEL
CO2: 30 mEq/L (ref 19–32)
Calcium: 8.4 mg/dL (ref 8.4–10.5)
Chloride: 102 mEq/L (ref 96–112)
GFR calc Af Amer: >90 mL/min (ref >90)
Sodium: 138 mEq/L (ref 135–145)

## 2013-02-13 LAB — CBC
Platelets: 177 10*3/uL (ref 150–400)
RBC: 2.91 MIL/uL — ABNORMAL LOW (ref 4.22–5.81)
WBC: 7.6 10*3/uL (ref 4.0–10.5)

## 2013-02-13 MED ORDER — NAPROXEN 500 MG PO TABS
500.0000 mg | ORAL_TABLET | Freq: Two times a day (BID) | ORAL | Status: DC
  Administered 2013-02-13 – 2013-02-14 (×2): 500 mg via ORAL
  Filled 2013-02-13 (×4): qty 1

## 2013-02-13 MED ORDER — ENOXAPARIN SODIUM 40 MG/0.4ML ~~LOC~~ SOLN
40.0000 mg | SUBCUTANEOUS | Status: DC
  Administered 2013-02-13 – 2013-02-14 (×2): 40 mg via SUBCUTANEOUS
  Filled 2013-02-13 (×2): qty 0.4

## 2013-02-13 MED ORDER — DOCUSATE SODIUM 100 MG PO CAPS
100.0000 mg | ORAL_CAPSULE | Freq: Two times a day (BID) | ORAL | Status: DC
  Administered 2013-02-13 – 2013-02-14 (×3): 100 mg via ORAL
  Filled 2013-02-13 (×3): qty 1
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
## 2013-02-13 NOTE — Progress Notes (Signed)
Clinical Social Work Department BRIEF PSYCHOSOCIAL ASSESSMENT 02/13/2013  Patient:  Gregory Hooper, Gregory Hooper     Account Number:  [id number]     Admit date:  02/10/2013  Clinical Social Worker:  Illene Silver  Date/Time:  02/12/2013 08:00 PM  Referred by:  Physician  Date Referred:  02/12/2013 Referred for  Psychosocial assessment   Other Referral:   Interview type:  Patient Other interview type:    PSYCHOSOCIAL DATA Living Status:  ALONE Admitted from facility:   Level of care:   Primary support name:  terry bean Primary support relationship to patient:  PARENT Degree of support available:   Pt reports that he will be d/c'ing home with mom and that she can provide 24 hour care.    CURRENT CONCERNS Current Concerns  Adjustment to Illness   Other Concerns:    SOCIAL WORK ASSESSMENT / PLAN CSW explained role of social work/discharge planning.  CSW will continue to follow pt should any d/c planning needs arise.   Assessment/plan status:  Psychosocial Support/Ongoing Assessment of Needs Other assessment/ plan:   Information/referral to community resources:    PATIENT'S/FAMILY'S RESPONSE TO PLAN OF CARE: Pt preoccupied with chest tube. Remarked that he was in a lot of pain.  Requested SBIRT done at a later time.

## 2013-02-13 NOTE — Progress Notes (Signed)
No air leak.  Hopefully output will be down enough to remove CT in 24 hours or so.  This patient has been seen and I agree with the findings and treatment plan.  Marta Lamas. Gae Bon, MD, FACS [phone] (pager) [phone] (direct pager) Trauma Surgeon
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
## 2013-02-14 ENCOUNTER — Inpatient Hospital Stay (HOSPITAL_COMMUNITY): Payer: No Typology Code available for payment source

## 2013-02-14 MED ORDER — OXYCODONE HCL 5 MG PO TABS
10.0000 mg | ORAL_TABLET | ORAL | Status: DC | PRN
  Administered 2013-02-14: 15 mg via ORAL
  Filled 2013-02-14: qty 2
  Filled 2013-02-14: qty 1

## 2013-02-14 MED ORDER — NAPROXEN 500 MG PO TABS: 500.0000 mg | ORAL_TABLET | Freq: Two times a day (BID) | ORAL | Status: DC

## 2013-02-14 MED ORDER — TRAMADOL HCL 50 MG PO TABS: 100.0000 mg | ORAL_TABLET | Freq: Four times a day (QID) | ORAL | Status: DC

## 2013-02-14 MED ORDER — TRAMADOL HCL 50 MG PO TABS
100.0000 mg | ORAL_TABLET | Freq: Four times a day (QID) | ORAL | Status: DC
  Administered 2013-02-14 (×2): 100 mg via ORAL
  Filled 2013-02-14 (×2): qty 2

## 2013-02-14 MED ORDER — OXYCODONE-ACETAMINOPHEN 10-325 MG PO TABS: 1.0000 | ORAL_TABLET | ORAL | Status: DC | PRN
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
## 2013-02-14 NOTE — Progress Notes (Signed)
Agree with removing CT.  Home later today seems appropriate.  This patient has been seen and I agree with the findings and treatment plan.  Marta Lamas. Gae Bon, MD, FACS [phone] (pager) [phone] (direct pager) Trauma Surgeon

## 2013-02-14 NOTE — Progress Notes (Signed)
Discharged to home

## 2013-02-15 ENCOUNTER — Telehealth (HOSPITAL_COMMUNITY): Payer: Self-pay | Admitting: Emergency Medicine

## 2013-02-18 ENCOUNTER — Encounter (HOSPITAL_COMMUNITY): Payer: Self-pay | Admitting: Emergency Medicine
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
## 2013-02-19 NOTE — Telephone Encounter (Signed)
Never received return phone call. 

## 2013-02-22 ENCOUNTER — Emergency Department (HOSPITAL_COMMUNITY)
Admission: EM | Admit: 2013-02-22 | Discharge: 2013-02-22 | Disposition: A | Discharge status: Home / Self Care | Payer: No Typology Code available for payment source | Attending: Emergency Medicine | Admitting: Emergency Medicine

## 2013-02-22 ENCOUNTER — Emergency Department (HOSPITAL_COMMUNITY): Payer: No Typology Code available for payment source

## 2013-02-22 ENCOUNTER — Encounter (HOSPITAL_COMMUNITY): Payer: Self-pay

## 2013-02-22 DIAGNOSIS — Z87828 Personal history of other (healed) physical injury and trauma: Secondary | ICD-10-CM | POA: Insufficient documentation

## 2013-02-22 DIAGNOSIS — R071 Chest pain on breathing: Secondary | ICD-10-CM | POA: Insufficient documentation

## 2013-02-22 DIAGNOSIS — R0789 Other chest pain: Secondary | ICD-10-CM

## 2013-02-22 DIAGNOSIS — F17200 Nicotine dependence, unspecified, uncomplicated: Secondary | ICD-10-CM | POA: Diagnosis not present

## 2013-02-22 DIAGNOSIS — G8918 Other acute postprocedural pain: Secondary | ICD-10-CM | POA: Insufficient documentation

## 2013-02-22 DIAGNOSIS — R079 Chest pain, unspecified: Secondary | ICD-10-CM | POA: Diagnosis present

## 2013-02-22 HISTORY — DX: Accidental discharge from unspecified firearms or gun, initial encounter: W34.00XA

## 2013-02-22 MED ORDER — KETOROLAC TROMETHAMINE 30 MG/ML IJ SOLN
30.0000 mg | Freq: Once | INTRAMUSCULAR | Status: AC
  Administered 2013-02-22: 30 mg via INTRAMUSCULAR
  Filled 2013-02-22: qty 1

## 2013-02-22 MED ORDER — HYDROMORPHONE HCL PF 1 MG/ML IJ SOLN
1.0000 mg | Freq: Once | INTRAMUSCULAR | Status: AC
  Administered 2013-02-22: 1 mg via INTRAMUSCULAR
  Filled 2013-02-22: qty 1

## 2013-02-22 NOTE — ED Notes (Signed)
Pt reports the need to take an increase of his pain medication in the last 3 days, pt states he took 3 Oxycodone 10 mg prior to arrival to ED
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
## 2013-02-27 ENCOUNTER — Emergency Department (HOSPITAL_COMMUNITY)
Admission: EM | Admit: 2013-02-27 | Discharge: 2013-02-27 | Disposition: A | Discharge status: Home / Self Care | Payer: No Typology Code available for payment source | Attending: Emergency Medicine | Admitting: Emergency Medicine

## 2013-02-27 ENCOUNTER — Telehealth (HOSPITAL_COMMUNITY): Payer: Self-pay | Admitting: Emergency Medicine

## 2013-02-27 ENCOUNTER — Encounter (HOSPITAL_COMMUNITY): Payer: Self-pay | Admitting: Emergency Medicine

## 2013-02-27 DIAGNOSIS — Z87828 Personal history of other (healed) physical injury and trauma: Secondary | ICD-10-CM | POA: Insufficient documentation

## 2013-02-27 DIAGNOSIS — F17200 Nicotine dependence, unspecified, uncomplicated: Secondary | ICD-10-CM | POA: Insufficient documentation

## 2013-02-27 DIAGNOSIS — Z79899 Other long term (current) drug therapy: Secondary | ICD-10-CM | POA: Insufficient documentation

## 2013-02-27 DIAGNOSIS — R079 Chest pain, unspecified: Secondary | ICD-10-CM | POA: Insufficient documentation

## 2013-02-27 DIAGNOSIS — Z760 Encounter for issue of repeat prescription: Secondary | ICD-10-CM | POA: Insufficient documentation

## 2013-02-27 MED ORDER — TRAMADOL HCL 50 MG PO TABS: 50.0000 mg | ORAL_TABLET | Freq: Four times a day (QID) | ORAL | Status: DC | PRN

## 2013-02-27 MED ORDER — TRAMADOL HCL 50 MG PO TABS
50.0000 mg | ORAL_TABLET | Freq: Once | ORAL | Status: AC
  Administered 2013-02-27: 50 mg via ORAL
  Filled 2013-02-27: qty 1

## 2013-02-27 MED ORDER — NAPROXEN 500 MG PO TABS: 500.0000 mg | ORAL_TABLET | Freq: Two times a day (BID) | ORAL | Status: DC
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
## 2013-02-27 NOTE — ED Notes (Signed)
Patient upset that he didn't get narcotics.  Patient doesn't want to have vitals and doesn't want to wait for PA to come back to speak with him.   He states "I will take it over her head".

## 2013-02-27 NOTE — ED Provider Notes (Signed)
Medical screening examination/treatment/procedure(s) were performed by non-physician practitioner and as supervising physician I was immediately available for consultation/collaboration.   Carleene Cooper III, MD 02/27/13 2005
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
## 2013-03-08 ENCOUNTER — Ambulatory Visit (INDEPENDENT_AMBULATORY_CARE_PROVIDER_SITE_OTHER): Payer: No Typology Code available for payment source | Admitting: Internal Medicine

## 2013-03-08 ENCOUNTER — Encounter (INDEPENDENT_AMBULATORY_CARE_PROVIDER_SITE_OTHER): Payer: Self-pay | Admitting: General Surgery

## 2013-03-08 ENCOUNTER — Encounter (INDEPENDENT_AMBULATORY_CARE_PROVIDER_SITE_OTHER): Payer: Self-pay | Admitting: Internal Medicine

## 2013-03-08 VITALS — BP 100/70 | HR 94 | Temp 97.4°F | Resp 18 | Ht 65.0 in | Wt 133.6 lb

## 2013-03-08 DIAGNOSIS — S270XXD Traumatic pneumothorax, subsequent encounter: Secondary | ICD-10-CM

## 2013-03-08 DIAGNOSIS — S0183XD Puncture wound without foreign body of other part of head, subsequent encounter: Secondary | ICD-10-CM

## 2013-03-08 DIAGNOSIS — S025XXD Fracture of tooth (traumatic), subsequent encounter for fracture with routine healing: Secondary | ICD-10-CM

## 2013-03-08 DIAGNOSIS — W3400XD Accidental discharge from unspecified firearms or gun, subsequent encounter: Secondary | ICD-10-CM

## 2013-03-08 DIAGNOSIS — Z5189 Encounter for other specified aftercare: Secondary | ICD-10-CM

## 2013-03-08 DIAGNOSIS — S21332D Puncture wound without foreign body of left front wall of thorax with penetration into thoracic cavity, subsequent encounter: Secondary | ICD-10-CM

## 2013-03-08 NOTE — Patient Instructions (Signed)
May return to work June 2nd if doing well Follow up with dental surgeon once VAP approved Follow up here as needed
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
## 2013-04-01 ENCOUNTER — Telehealth (HOSPITAL_COMMUNITY): Payer: Self-pay | Admitting: Emergency Medicine

## 2013-04-01 NOTE — Telephone Encounter (Signed)
Needs letter stating dates he was out of work.

## 2013-04-02 NOTE — Telephone Encounter (Signed)
Patient will pick up letter on 6/18.

## 2013-04-05 ENCOUNTER — Telehealth (HOSPITAL_COMMUNITY): Payer: Self-pay | Admitting: Emergency Medicine

## 2013-04-08 NOTE — Telephone Encounter (Signed)
No answer, unable to leave message °

## 2013-04-10 NOTE — Telephone Encounter (Signed)
#   was wrong number. Fast busy signal for number in [employer]. Will wait for pt to call back.

## 2013-07-09 ENCOUNTER — Emergency Department (HOSPITAL_COMMUNITY)
Admission: EM | Admit: 2013-07-09 | Discharge: 2013-07-09 | Disposition: A | Discharge status: Home / Self Care | Payer: No Typology Code available for payment source | Attending: Emergency Medicine | Admitting: Emergency Medicine

## 2013-07-09 ENCOUNTER — Encounter (HOSPITAL_COMMUNITY): Payer: Self-pay | Admitting: Emergency Medicine

## 2013-07-09 DIAGNOSIS — F17200 Nicotine dependence, unspecified, uncomplicated: Secondary | ICD-10-CM | POA: Insufficient documentation

## 2013-07-09 DIAGNOSIS — Y929 Unspecified place or not applicable: Secondary | ICD-10-CM | POA: Insufficient documentation

## 2013-07-09 DIAGNOSIS — W11XXXA Fall on and from ladder, initial encounter: Secondary | ICD-10-CM | POA: Insufficient documentation

## 2013-07-09 DIAGNOSIS — M549 Dorsalgia, unspecified: Secondary | ICD-10-CM

## 2013-07-09 DIAGNOSIS — Y939 Activity, unspecified: Secondary | ICD-10-CM | POA: Insufficient documentation

## 2013-07-09 DIAGNOSIS — IMO0002 Reserved for concepts with insufficient information to code with codable children: Secondary | ICD-10-CM | POA: Insufficient documentation

## 2013-07-09 MED ORDER — DEXAMETHASONE SODIUM PHOSPHATE 10 MG/ML IJ SOLN
10.0000 mg | Freq: Once | INTRAMUSCULAR | Status: AC
  Administered 2013-07-09: 10 mg via INTRAMUSCULAR
  Filled 2013-07-09: qty 1

## 2013-07-09 MED ORDER — HYDROCODONE-ACETAMINOPHEN 5-325 MG PO TABS
2.0000 | ORAL_TABLET | Freq: Once | ORAL | Status: AC
  Administered 2013-07-09: 2 via ORAL
  Filled 2013-07-09: qty 2

## 2013-07-09 MED ORDER — METHOCARBAMOL 500 MG PO TABS: 1000.0000 mg | ORAL_TABLET | Freq: Two times a day (BID) | ORAL | Status: DC

## 2013-07-09 MED ORDER — HYDROCODONE-ACETAMINOPHEN 5-325 MG PO TABS: 1.0000 | ORAL_TABLET | ORAL | Status: DC | PRN

## 2013-07-09 MED ORDER — MELOXICAM 15 MG PO TABS: 15.0000 mg | ORAL_TABLET | Freq: Every day | ORAL | Status: DC

## 2013-07-09 MED ORDER — KETOROLAC TROMETHAMINE 60 MG/2ML IM SOLN
60.0000 mg | Freq: Once | INTRAMUSCULAR | Status: AC
  Administered 2013-07-09: 60 mg via INTRAMUSCULAR
  Filled 2013-07-09: qty 2

## 2013-07-09 NOTE — ED Notes (Signed)
Pt reports L lower back pain since falling off ladder a month ago. Denies any new symptoms. Pt states he was not seen after falling off ladder. Pt ambulatory
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
## 2013-07-10 ENCOUNTER — Emergency Department (HOSPITAL_COMMUNITY)
Admission: EM | Admit: 2013-07-10 | Discharge: 2013-07-10 | Disposition: A | Discharge status: Home / Self Care | Payer: No Typology Code available for payment source | Attending: Emergency Medicine | Admitting: Emergency Medicine

## 2013-07-10 ENCOUNTER — Emergency Department (HOSPITAL_COMMUNITY): Payer: No Typology Code available for payment source

## 2013-07-10 ENCOUNTER — Encounter (HOSPITAL_COMMUNITY): Payer: Self-pay | Admitting: Family Medicine

## 2013-07-10 DIAGNOSIS — M545 Low back pain, unspecified: Secondary | ICD-10-CM | POA: Insufficient documentation

## 2013-07-10 DIAGNOSIS — Z87828 Personal history of other (healed) physical injury and trauma: Secondary | ICD-10-CM | POA: Insufficient documentation

## 2013-07-10 DIAGNOSIS — M549 Dorsalgia, unspecified: Secondary | ICD-10-CM

## 2013-07-10 DIAGNOSIS — Z79899 Other long term (current) drug therapy: Secondary | ICD-10-CM | POA: Insufficient documentation

## 2013-07-10 DIAGNOSIS — Z792 Long term (current) use of antibiotics: Secondary | ICD-10-CM | POA: Insufficient documentation

## 2013-07-10 DIAGNOSIS — F17200 Nicotine dependence, unspecified, uncomplicated: Secondary | ICD-10-CM | POA: Insufficient documentation

## 2013-07-10 MED ORDER — DIAZEPAM 5 MG/ML IJ SOLN
5.0000 mg | Freq: Once | INTRAMUSCULAR | Status: AC
  Administered 2013-07-10: 5 mg via INTRAMUSCULAR
  Filled 2013-07-10: qty 2

## 2013-07-10 NOTE — ED Notes (Signed)
Patient did not sign discharge before leaving.

## 2013-07-10 NOTE — ED Notes (Signed)
Patient transported to X-ray 

## 2013-07-10 NOTE — ED Notes (Signed)
Patient states that is having lower back pain. States he fell off ladder a month ago. Was seen here last night and rec'd Lortab and muscle relaxant. States pain is worse. Was unable to sleep "so my mom gave me a Xanax." Last had Lortab and muscle relaxant at 630pm.
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
## 2013-07-11 NOTE — ED Provider Notes (Signed)
Medical screening examination/treatment/procedure(s) were performed by non-physician practitioner and as supervising physician I was immediately available for consultation/collaboration.   Junius Argyle, MD 07/11/13 [phone]

## 2013-07-12 NOTE — ED Provider Notes (Signed)
Medical screening examination/treatment/procedure(s) were performed by non-physician practitioner and as supervising physician I was immediately available for consultation/collaboration.    Syna Gad R Zohaib Heeney, MD 07/12/13 0339 

## 2013-08-07 ENCOUNTER — Emergency Department (HOSPITAL_COMMUNITY)
Admission: EM | Admit: 2013-08-07 | Discharge: 2013-08-07 | Disposition: A | Discharge status: Home / Self Care | Payer: No Typology Code available for payment source | Attending: Emergency Medicine | Admitting: Emergency Medicine

## 2013-08-07 ENCOUNTER — Encounter (HOSPITAL_COMMUNITY): Payer: Self-pay | Admitting: Emergency Medicine

## 2013-08-07 DIAGNOSIS — IMO0002 Reserved for concepts with insufficient information to code with codable children: Secondary | ICD-10-CM | POA: Diagnosis present

## 2013-08-07 DIAGNOSIS — Y990 Civilian activity done for income or pay: Secondary | ICD-10-CM | POA: Diagnosis not present

## 2013-08-07 DIAGNOSIS — X500XXA Overexertion from strenuous movement or load, initial encounter: Secondary | ICD-10-CM | POA: Diagnosis not present

## 2013-08-07 DIAGNOSIS — Y9289 Other specified places as the place of occurrence of the external cause: Secondary | ICD-10-CM | POA: Diagnosis not present

## 2013-08-07 DIAGNOSIS — S239XXA Sprain of unspecified parts of thorax, initial encounter: Secondary | ICD-10-CM | POA: Diagnosis not present

## 2013-08-07 DIAGNOSIS — Z87828 Personal history of other (healed) physical injury and trauma: Secondary | ICD-10-CM | POA: Insufficient documentation

## 2013-08-07 DIAGNOSIS — Y9389 Activity, other specified: Secondary | ICD-10-CM | POA: Insufficient documentation

## 2013-08-07 DIAGNOSIS — M62830 Muscle spasm of back: Secondary | ICD-10-CM

## 2013-08-07 DIAGNOSIS — F17200 Nicotine dependence, unspecified, uncomplicated: Secondary | ICD-10-CM | POA: Insufficient documentation

## 2013-08-07 DIAGNOSIS — S29019A Strain of muscle and tendon of unspecified wall of thorax, initial encounter: Secondary | ICD-10-CM

## 2013-08-07 MED ORDER — HYDROCODONE-ACETAMINOPHEN 5-325 MG PO TABS: 1.0000 | ORAL_TABLET | Freq: Four times a day (QID) | ORAL | Status: DC | PRN

## 2013-08-07 MED ORDER — IBUPROFEN 800 MG PO TABS: 800.0000 mg | ORAL_TABLET | Freq: Three times a day (TID) | ORAL | Status: DC | PRN

## 2013-08-07 MED ORDER — METHOCARBAMOL 750 MG PO TABS: 750.0000 mg | ORAL_TABLET | Freq: Four times a day (QID) | ORAL | Status: DC | PRN

## 2013-08-07 NOTE — ED Notes (Signed)
Pt. Stated, i twisted my back moving an oak door. Lower back.  i was at work.
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
## 2013-08-08 NOTE — ED Provider Notes (Signed)
Medical screening examination/treatment/procedure(s) were performed by non-physician practitioner and as supervising physician I was immediately available for consultation/collaboration.    Junius Argyle, MD 08/08/13 [phone]

## 2013-08-19 ENCOUNTER — Emergency Department (HOSPITAL_COMMUNITY): Payer: No Typology Code available for payment source

## 2013-08-19 ENCOUNTER — Emergency Department (HOSPITAL_COMMUNITY)
Admission: EM | Admit: 2013-08-19 | Discharge: 2013-08-19 | Disposition: A | Discharge status: Home / Self Care | Payer: No Typology Code available for payment source | Attending: Emergency Medicine | Admitting: Emergency Medicine

## 2013-08-19 ENCOUNTER — Encounter (HOSPITAL_COMMUNITY): Payer: Self-pay | Admitting: Emergency Medicine

## 2013-08-19 DIAGNOSIS — M545 Low back pain, unspecified: Secondary | ICD-10-CM | POA: Insufficient documentation

## 2013-08-19 DIAGNOSIS — N50812 Left testicular pain: Secondary | ICD-10-CM

## 2013-08-19 DIAGNOSIS — N509 Disorder of male genital organs, unspecified: Secondary | ICD-10-CM | POA: Insufficient documentation

## 2013-08-19 DIAGNOSIS — F17200 Nicotine dependence, unspecified, uncomplicated: Secondary | ICD-10-CM | POA: Insufficient documentation

## 2013-08-19 DIAGNOSIS — G8929 Other chronic pain: Secondary | ICD-10-CM | POA: Insufficient documentation

## 2013-08-19 DIAGNOSIS — Z79899 Other long term (current) drug therapy: Secondary | ICD-10-CM | POA: Insufficient documentation

## 2013-08-19 LAB — URINALYSIS, ROUTINE W REFLEX MICROSCOPIC
Bilirubin Urine: NEGATIVE
Hgb urine dipstick: NEGATIVE
Ketones, ur: NEGATIVE mg/dL
Nitrite: NEGATIVE
Protein, ur: NEGATIVE mg/dL
Specific Gravity, Urine: 1.016 (ref 1.005–1.030)
Urobilinogen, UA: 0.2 mg/dL (ref 0.0–1.0)

## 2013-08-19 MED ORDER — MORPHINE SULFATE 4 MG/ML IJ SOLN
4.0000 mg | Freq: Once | INTRAMUSCULAR | Status: AC
  Administered 2013-08-19: 4 mg via INTRAVENOUS
  Filled 2013-08-19: qty 1

## 2013-08-19 MED ORDER — KETOROLAC TROMETHAMINE 15 MG/ML IJ SOLN
15.0000 mg | Freq: Once | INTRAMUSCULAR | Status: AC
  Administered 2013-08-19: 15 mg via INTRAVENOUS
  Filled 2013-08-19: qty 1
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
## 2013-08-19 NOTE — ED Notes (Signed)
MD Masenri at bedside.

## 2013-08-19 NOTE — ED Notes (Addendum)
Left testicle pain x years and last night it got worse  States does not feel swollen or red denies dysuria

## 2013-08-19 NOTE — ED Notes (Signed)
Pt returned from [place], placed on BP cuff and continuous pulse ox

## 2013-08-19 NOTE — ED Notes (Addendum)
Pt c/o intermittent Left testicle pain x2 years, pt states it's persistent at times for weeks to months then subsides for weeks to months. Pt reports the pain started last night and has been constant sharp shooting pain to the back side of his Left testicle. Pt denies any injury in the past or recent, pt also denies burning with urination or penile discharge.

## 2013-08-19 NOTE — ED Notes (Signed)
Patient transported to Ultrasound 

## 2013-08-19 NOTE — ED Notes (Signed)
Removed pts iv 

## 2013-08-20 LAB — GC/CHLAMYDIA PROBE AMP: GC Probe RNA: NEGATIVE

## 2013-09-23 ENCOUNTER — Encounter (HOSPITAL_COMMUNITY): Payer: Self-pay | Admitting: Emergency Medicine

## 2013-09-23 ENCOUNTER — Emergency Department (HOSPITAL_COMMUNITY)
Admission: EM | Admit: 2013-09-23 | Discharge: 2013-09-23 | Disposition: A | Discharge status: Home / Self Care | Payer: No Typology Code available for payment source | Attending: Emergency Medicine | Admitting: Emergency Medicine

## 2013-09-23 DIAGNOSIS — H5712 Ocular pain, left eye: Secondary | ICD-10-CM

## 2013-09-23 DIAGNOSIS — F17200 Nicotine dependence, unspecified, uncomplicated: Secondary | ICD-10-CM | POA: Insufficient documentation

## 2013-09-23 DIAGNOSIS — S0590XA Unspecified injury of unspecified eye and orbit, initial encounter: Secondary | ICD-10-CM | POA: Insufficient documentation

## 2013-09-23 DIAGNOSIS — Y929 Unspecified place or not applicable: Secondary | ICD-10-CM | POA: Insufficient documentation

## 2013-09-23 DIAGNOSIS — Y9389 Activity, other specified: Secondary | ICD-10-CM | POA: Insufficient documentation

## 2013-09-23 DIAGNOSIS — T1590XA Foreign body on external eye, part unspecified, unspecified eye, initial encounter: Secondary | ICD-10-CM | POA: Insufficient documentation

## 2013-09-23 DIAGNOSIS — S058X2A Other injuries of left eye and orbit, initial encounter: Secondary | ICD-10-CM

## 2013-09-23 MED ORDER — FLUORESCEIN SODIUM 1 MG OP STRP
1.0000 | ORAL_STRIP | Freq: Once | OPHTHALMIC | Status: AC
  Administered 2013-09-23: 1 via OPHTHALMIC
  Filled 2013-09-23: qty 1

## 2013-09-23 MED ORDER — OXYCODONE-ACETAMINOPHEN 5-325 MG PO TABS: 1.0000 | ORAL_TABLET | Freq: Four times a day (QID) | ORAL | Status: DC | PRN

## 2013-09-23 MED ORDER — TOBRAMYCIN 0.3 % OP SOLN: 2.0000 [drp] | OPHTHALMIC | Status: DC

## 2013-09-23 MED ORDER — OXYCODONE-ACETAMINOPHEN 5-325 MG PO TABS
1.0000 | ORAL_TABLET | Freq: Once | ORAL | Status: AC
  Administered 2013-09-23: 1 via ORAL
  Filled 2013-09-23: qty 1

## 2013-09-23 MED ORDER — TETRACAINE HCL 0.5 % OP SOLN
2.0000 [drp] | Freq: Once | OPHTHALMIC | Status: AC
  Administered 2013-09-23: 2 [drp] via OPHTHALMIC
  Filled 2013-09-23: qty 4

## 2013-09-23 NOTE — ED Notes (Signed)
Pt was grinding some metal and was wearing safety goggles, pt states he felt the metal go under his goggles and into his left eye. Pt states he has been attempted to flush the metal out all day with no success. Pt rates pain 6/10. Pt is sensitive to light.

## 2013-09-23 NOTE — Discharge Instructions (Signed)
Please use the eyedrops as prescribed. Followup with up the eye specialists for continued evaluation and treatment.   Corneal Abrasion The cornea is the clear covering at the front and center of the eye. When looking at the colored portion (iris) of the eye, you are looking through that person's cornea.  This very thin tissue is made up of many layers. The surface layer is a single layer of cells called the corneal epithelium. This is one of the most sensitive tissues in the body. If a scratch or injury causes the corneal epithelium to come off, it is called a corneal abrasion. If the injury extends to the tissues below the epithelium, the condition is called a corneal ulcer.  CAUSES   Scratches.  Trauma.  Foreign body in the eye.  Some people have recurrences of abrasions in the area of the original injury even after they heal. This is called recurrent erosion syndrome. Recurrent erosion syndromes generally improve and go away with time. SYMPTOMS   Eye pain.  Difficulty or inability to keep the injured eye open.  The eye becomes very sensitive to light.  Recurrent erosions tend to happen suddenly, first thing in the morning  usually upon awakening and opening the eyes. DIAGNOSIS  Your eye professional can diagnose a corneal abrasion during an eye exam. Dye is usually placed in the eye using a drop or a small paper strip moistened by the patient's tears. When the eye is examined with a special light, the abrasion shows up clearly because of the dye. TREATMENT   Small abrasions may be treated with antibiotic drops or ointment alone.  Usually a pressure patch is specially applied. Pressure patches prevent the eye from blinking, allowing the corneal epithelium to heal. Because blinking is less, a pressure patch also reduces the amount of pain present in the eye during healing. Most corneal abrasions heal within 2-3 days with no effect on vision. WARNING: Do not drive or operate machinery  while your eye is patched. Your ability to judge distances is impaired.  If abrasion becomes infected and spreads to the deeper tissues of the cornea, a corneal ulcer can result. This is serious because it can cause corneal scarring. Corneal scars interfere with light passing through the cornea, and cause a loss of vision in the involved eye.  If your caregiver has given you a follow-up appointment, it is very important to keep that appointment. Not keeping the appointment could result in a severe eye infection or permanent loss of vision. If there is any problem keeping the appointment, you must call back to this facility for assistance. SEEK MEDICAL CARE IF:   You have pain, light sensitivity and a scratchy feeling in one eye (or both).  Your pressure patch keeps loosening up and you can blink your eye under the patch after treatment.  Any kind of discharge develops from the involved eye after treatment or if the lids stick together in the morning.  You have the same symptoms in the morning as you did with the original abrasion days, weeks or months after the abrasion healed. MAKE SURE YOU:   Understand these instructions.  Will watch your condition.  Will get help right away if you are not doing well or get worse. Document Released: 09/30/2000 Document Revised: 12/26/2011 Document Reviewed: 05/08/2008 [facility] Patient Information 2014 [place], [place].

## 2013-09-23 NOTE — ED Notes (Signed)
Pt was grinding metal around noon, metal hit forehead, hit inside of safety glasses and went into L eye, L eye red irritated, f/b noted, took 800mg  ibuprofen ~2-3 hrs ago. Rates pain 8/10. Td UTD (3 yrs ago), have flushed eye with water w/o relief. Here with others. OD 20/20, OS 20/30, OU 20/20. Does not wear contacts or glasses.
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
## 2013-09-25 ENCOUNTER — Encounter (HOSPITAL_COMMUNITY): Payer: Self-pay | Admitting: Emergency Medicine

## 2013-09-25 ENCOUNTER — Emergency Department (HOSPITAL_COMMUNITY)
Admission: EM | Admit: 2013-09-25 | Discharge: 2013-09-26 | Disposition: A | Discharge status: Home / Self Care | Payer: No Typology Code available for payment source | Attending: Emergency Medicine | Admitting: Emergency Medicine

## 2013-09-25 DIAGNOSIS — S0502XA Injury of conjunctiva and corneal abrasion without foreign body, left eye, initial encounter: Secondary | ICD-10-CM

## 2013-09-25 DIAGNOSIS — Y9389 Activity, other specified: Secondary | ICD-10-CM | POA: Insufficient documentation

## 2013-09-25 DIAGNOSIS — S058X9A Other injuries of unspecified eye and orbit, initial encounter: Secondary | ICD-10-CM | POA: Insufficient documentation

## 2013-09-25 DIAGNOSIS — T1590XA Foreign body on external eye, part unspecified, unspecified eye, initial encounter: Secondary | ICD-10-CM | POA: Insufficient documentation

## 2013-09-25 DIAGNOSIS — Z79899 Other long term (current) drug therapy: Secondary | ICD-10-CM | POA: Insufficient documentation

## 2013-09-25 DIAGNOSIS — Y9289 Other specified places as the place of occurrence of the external cause: Secondary | ICD-10-CM | POA: Insufficient documentation

## 2013-09-25 DIAGNOSIS — H109 Unspecified conjunctivitis: Secondary | ICD-10-CM | POA: Insufficient documentation

## 2013-09-25 DIAGNOSIS — F17200 Nicotine dependence, unspecified, uncomplicated: Secondary | ICD-10-CM | POA: Insufficient documentation

## 2013-09-25 MED ORDER — FLUORESCEIN SODIUM 1 MG OP STRP
1.0000 | ORAL_STRIP | Freq: Once | OPHTHALMIC | Status: AC
  Administered 2013-09-26: 1 via OPHTHALMIC
  Filled 2013-09-25: qty 1

## 2013-09-25 MED ORDER — TETRACAINE HCL 0.5 % OP SOLN
2.0000 [drp] | Freq: Once | OPHTHALMIC | Status: AC
  Administered 2013-09-26: 2 [drp] via OPHTHALMIC
  Filled 2013-09-25: qty 2

## 2013-09-25 NOTE — ED Notes (Signed)
Pt complains of eye pain since Monday, was seen at [facility] and there is a small piece of metal in his eye, eye is now more painful and becoming blurry

## 2013-09-25 NOTE — ED Provider Notes (Signed)
Medical screening examination/treatment/procedure(s) were performed by non-physician practitioner and as supervising physician I was immediately available for consultation/collaboration.  EKG Interpretation   None         Junius Argyle, MD 09/25/13 [phone]

## 2013-09-26 MED ORDER — OFLOXACIN 0.3 % OP SOLN: 1.0000 [drp] | Freq: Four times a day (QID) | OPHTHALMIC | Status: DC

## 2013-09-26 MED ORDER — OXYCODONE-ACETAMINOPHEN 5-325 MG PO TABS: 1.0000 | ORAL_TABLET | Freq: Four times a day (QID) | ORAL | Status: DC | PRN

## 2013-09-26 NOTE — ED Provider Notes (Signed)
Medical screening examination/treatment/procedure(s) were performed by non-physician practitioner and as supervising physician I was immediately available for consultation/collaboration.    Vida Roller, MD 09/26/13 [phone]
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
## 2013-11-20 ENCOUNTER — Emergency Department (HOSPITAL_COMMUNITY)
Admission: EM | Admit: 2013-11-20 | Discharge: 2013-11-20 | Discharge status: Against Medical Advice | Payer: Self-pay | Attending: Emergency Medicine | Admitting: Emergency Medicine

## 2013-11-20 DIAGNOSIS — K089 Disorder of teeth and supporting structures, unspecified: Secondary | ICD-10-CM | POA: Insufficient documentation

## 2013-11-20 NOTE — ED Notes (Signed)
Pt not present in ER waiting room

## 2013-11-20 NOTE — ED Notes (Signed)
Called once for triage, no answer 

## 2013-12-30 ENCOUNTER — Encounter (HOSPITAL_COMMUNITY): Payer: Self-pay | Admitting: Emergency Medicine

## 2013-12-30 ENCOUNTER — Emergency Department (HOSPITAL_COMMUNITY)
Admission: EM | Admit: 2013-12-30 | Discharge: 2013-12-30 | Disposition: A | Discharge status: Home / Self Care | Payer: Self-pay | Attending: Emergency Medicine | Admitting: Emergency Medicine

## 2013-12-30 ENCOUNTER — Emergency Department (HOSPITAL_COMMUNITY): Payer: Self-pay

## 2013-12-30 DIAGNOSIS — Z792 Long term (current) use of antibiotics: Secondary | ICD-10-CM | POA: Insufficient documentation

## 2013-12-30 DIAGNOSIS — R51 Headache: Secondary | ICD-10-CM | POA: Insufficient documentation

## 2013-12-30 DIAGNOSIS — IMO0001 Reserved for inherently not codable concepts without codable children: Secondary | ICD-10-CM | POA: Insufficient documentation

## 2013-12-30 DIAGNOSIS — F17200 Nicotine dependence, unspecified, uncomplicated: Secondary | ICD-10-CM | POA: Insufficient documentation

## 2013-12-30 DIAGNOSIS — M546 Pain in thoracic spine: Secondary | ICD-10-CM | POA: Insufficient documentation

## 2013-12-30 DIAGNOSIS — M791 Myalgia, unspecified site: Secondary | ICD-10-CM

## 2013-12-30 MED ORDER — METHOCARBAMOL 500 MG PO TABS: 500.0000 mg | ORAL_TABLET | Freq: Two times a day (BID) | ORAL | Status: AC

## 2013-12-30 MED ORDER — MELOXICAM 7.5 MG PO TABS: 7.5000 mg | ORAL_TABLET | Freq: Every day | ORAL | Status: DC

## 2013-12-30 MED ORDER — DIAZEPAM 5 MG/ML IJ SOLN
5.0000 mg | Freq: Once | INTRAMUSCULAR | Status: AC
  Administered 2013-12-30: 5 mg via INTRAMUSCULAR
  Filled 2013-12-30: qty 2

## 2013-12-30 NOTE — Progress Notes (Signed)
[facility] provided pt with a list of primary care resources and a [facility] [place] Card application to help patient establish primary care.  °
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
## 2013-12-31 NOTE — ED Provider Notes (Signed)
Medical screening examination/treatment/procedure(s) were performed by non-physician practitioner and as supervising physician I was immediately available for consultation/collaboration.   EKG Interpretation None        Carlus Stay M Scotti Kosta, MD 12/31/13 1638 

## 2014-01-08 ENCOUNTER — Ambulatory Visit: Payer: Self-pay

## 2014-01-10 ENCOUNTER — Encounter: Payer: Self-pay | Admitting: Internal Medicine

## 2014-01-10 ENCOUNTER — Ambulatory Visit: Payer: Self-pay | Attending: Internal Medicine | Admitting: Internal Medicine

## 2014-01-10 VITALS — BP 135/77 | HR 84 | Temp 98.6°F | Resp 16 | Ht 65.0 in | Wt 134.0 lb

## 2014-01-10 DIAGNOSIS — M549 Dorsalgia, unspecified: Secondary | ICD-10-CM

## 2014-01-10 MED ORDER — ACETAMINOPHEN-CODEINE #3 300-30 MG PO TABS: 1.0000 | ORAL_TABLET | ORAL | Status: DC | PRN
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
## 2014-01-10 NOTE — Patient Instructions (Signed)
Back Pain, Adult Low back pain is very common. About 1 in 5 people have back pain.The cause of low back pain is rarely dangerous. The pain often gets better over time.About half of people with a sudden onset of back pain feel better in just 2 weeks. About 8 in 10 people feel better by 6 weeks.  CAUSES Some common causes of back pain include:  Strain of the muscles or ligaments supporting the spine.  Wear and tear (degeneration) of the spinal discs.  Arthritis.  Direct injury to the back. DIAGNOSIS Most of the time, the direct cause of low back pain is not known.However, back pain can be treated effectively even when the exact cause of the pain is unknown.Answering your caregiver's questions about your overall health and symptoms is one of the most accurate ways to make sure the cause of your pain is not dangerous. If your caregiver needs more information, he or she may order lab work or imaging tests (X-rays or MRIs).However, even if imaging tests show changes in your back, this usually does not require surgery. HOME CARE INSTRUCTIONS For many people, back pain returns.Since low back pain is rarely dangerous, it is often a condition that people can learn to manageon their own.   Remain active. It is stressful on the back to sit or stand in one place. Do not sit, drive, or stand in one place for more than 30 minutes at a time. Take short walks on level surfaces as soon as pain allows.Try to increase the length of time you walk each day.  Do not stay in bed.Resting more than 1 or 2 days can delay your recovery.  Do not avoid exercise or work.Your body is made to move.It is not dangerous to be active, even though your back may hurt.Your back will likely heal faster if you return to being active before your pain is gone.  Pay attention to your body when you bend and lift. Many people have less discomfortwhen lifting if they bend their knees, keep the load close to their bodies,and  avoid twisting. Often, the most comfortable positions are those that put less stress on your recovering back.  Find a comfortable position to sleep. Use a firm mattress and lie on your side with your knees slightly bent. If you lie on your back, put a pillow under your knees.  Only take over-the-counter or prescription medicines as directed by your caregiver. Over-the-counter medicines to reduce pain and inflammation are often the most helpful.Your caregiver may prescribe muscle relaxant drugs.These medicines help dull your pain so you can more quickly return to your normal activities and healthy exercise.  Put ice on the injured area.  Put ice in a plastic bag.  Place a towel between your skin and the bag.  Leave the ice on for 15-20 minutes, 03-04 times a day for the first 2 to 3 days. After that, ice and heat may be alternated to reduce pain and spasms.  Ask your caregiver about trying back exercises and gentle massage. This may be of some benefit.  Avoid feeling anxious or stressed.Stress increases muscle tension and can worsen back pain.It is important to recognize when you are anxious or stressed and learn ways to manage it.Exercise is a great option. SEEK MEDICAL CARE IF:  You have pain that is not relieved with rest or medicine.  You have pain that does not improve in 1 week.  You have new symptoms.  You are generally not feeling well. SEEK   IMMEDIATE MEDICAL CARE IF:   You have pain that radiates from your back into your legs.  You develop new bowel or bladder control problems.  You have unusual weakness or numbness in your arms or legs.  You develop nausea or vomiting.  You develop abdominal pain.  You feel faint. Document Released: 10/03/2005 Document Revised: 04/03/2012 Document Reviewed: 02/21/2011 Exit[employer] Patient Information 2014 [employer].  
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
## 2014-01-18 ENCOUNTER — Encounter (HOSPITAL_COMMUNITY): Payer: Self-pay | Admitting: Emergency Medicine

## 2014-01-18 ENCOUNTER — Emergency Department (HOSPITAL_COMMUNITY)
Admission: EM | Admit: 2014-01-18 | Discharge: 2014-01-18 | Disposition: A | Discharge status: Home / Self Care | Payer: Self-pay | Attending: Emergency Medicine | Admitting: Emergency Medicine

## 2014-01-18 DIAGNOSIS — Z79899 Other long term (current) drug therapy: Secondary | ICD-10-CM | POA: Insufficient documentation

## 2014-01-18 DIAGNOSIS — M542 Cervicalgia: Secondary | ICD-10-CM | POA: Insufficient documentation

## 2014-01-18 DIAGNOSIS — M546 Pain in thoracic spine: Secondary | ICD-10-CM | POA: Insufficient documentation

## 2014-01-18 DIAGNOSIS — G8929 Other chronic pain: Secondary | ICD-10-CM | POA: Insufficient documentation

## 2014-01-18 DIAGNOSIS — Z87828 Personal history of other (healed) physical injury and trauma: Secondary | ICD-10-CM | POA: Insufficient documentation

## 2014-01-18 DIAGNOSIS — F17200 Nicotine dependence, unspecified, uncomplicated: Secondary | ICD-10-CM | POA: Insufficient documentation

## 2014-01-18 MED ORDER — KETOROLAC TROMETHAMINE 60 MG/2ML IM SOLN
60.0000 mg | Freq: Once | INTRAMUSCULAR | Status: AC
  Administered 2014-01-18: 60 mg via INTRAMUSCULAR
  Filled 2014-01-18: qty 2

## 2014-01-18 MED ORDER — PREDNISONE 50 MG PO TABS: 50.0000 mg | ORAL_TABLET | Freq: Every day | ORAL | Status: AC

## 2014-01-18 MED ORDER — HYDROCODONE-ACETAMINOPHEN 5-325 MG PO TABS: 1.0000 | ORAL_TABLET | Freq: Four times a day (QID) | ORAL | Status: AC | PRN

## 2014-01-18 MED ORDER — HYDROMORPHONE HCL PF 1 MG/ML IJ SOLN
1.0000 mg | Freq: Once | INTRAMUSCULAR | Status: AC
  Administered 2014-01-18: 1 mg via INTRAMUSCULAR
  Filled 2014-01-18: qty 1

## 2014-01-18 MED ORDER — CYCLOBENZAPRINE HCL 10 MG PO TABS: 10.0000 mg | ORAL_TABLET | Freq: Three times a day (TID) | ORAL | Status: AC | PRN

## 2014-01-18 NOTE — ED Provider Notes (Signed)
Medical screening examination/treatment/procedure(s) were performed by non-physician practitioner and as supervising physician I was immediately available for consultation/collaboration.  Marnae Madani T Shatisha Falter, MD 01/18/14 2310 
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
## 2014-01-18 NOTE — Discharge Instructions (Signed)
Return here as needed.  Followup at the wellness [facility].  Use ice and heat on your back

## 2014-01-20 ENCOUNTER — Telehealth: Payer: Self-pay | Admitting: Internal Medicine

## 2014-01-20 NOTE — Telephone Encounter (Signed)
Pt. Dropped off paperwork for medicaid...[facility]

## 2014-02-27 ENCOUNTER — Emergency Department (HOSPITAL_COMMUNITY)
Admission: EM | Admit: 2014-02-27 | Discharge: 2014-02-27 | Discharge status: Against Medical Advice | Payer: Self-pay | Attending: Emergency Medicine | Admitting: Emergency Medicine

## 2014-02-27 ENCOUNTER — Encounter (HOSPITAL_COMMUNITY): Payer: Self-pay | Admitting: Emergency Medicine

## 2014-02-27 DIAGNOSIS — Y929 Unspecified place or not applicable: Secondary | ICD-10-CM | POA: Insufficient documentation

## 2014-02-27 DIAGNOSIS — Y9389 Activity, other specified: Secondary | ICD-10-CM | POA: Insufficient documentation

## 2014-02-27 DIAGNOSIS — T1590XA Foreign body on external eye, part unspecified, unspecified eye, initial encounter: Secondary | ICD-10-CM | POA: Insufficient documentation

## 2014-02-27 DIAGNOSIS — F17200 Nicotine dependence, unspecified, uncomplicated: Secondary | ICD-10-CM | POA: Insufficient documentation
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
## 2014-02-27 NOTE — ED Notes (Signed)
Called for patient in waiting room, patient not present.

## 2014-02-27 NOTE — ED Notes (Signed)
Called for patient in waiting room, patient not present. 
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
## 2014-03-07 ENCOUNTER — Encounter (HOSPITAL_COMMUNITY): Payer: Self-pay | Admitting: Emergency Medicine

## 2014-03-07 ENCOUNTER — Emergency Department (HOSPITAL_COMMUNITY)
Admission: EM | Admit: 2014-03-07 | Discharge: 2014-03-07 | Disposition: A | Discharge status: Home / Self Care | Payer: Self-pay | Attending: Emergency Medicine | Admitting: Emergency Medicine

## 2014-03-07 DIAGNOSIS — IMO0002 Reserved for concepts with insufficient information to code with codable children: Secondary | ICD-10-CM | POA: Insufficient documentation

## 2014-03-07 DIAGNOSIS — Y9389 Activity, other specified: Secondary | ICD-10-CM | POA: Insufficient documentation

## 2014-03-07 DIAGNOSIS — F17200 Nicotine dependence, unspecified, uncomplicated: Secondary | ICD-10-CM | POA: Insufficient documentation

## 2014-03-07 DIAGNOSIS — T1590XA Foreign body on external eye, part unspecified, unspecified eye, initial encounter: Secondary | ICD-10-CM | POA: Insufficient documentation

## 2014-03-07 DIAGNOSIS — T1500XA Foreign body in cornea, unspecified eye, initial encounter: Secondary | ICD-10-CM | POA: Insufficient documentation

## 2014-03-07 DIAGNOSIS — Y929 Unspecified place or not applicable: Secondary | ICD-10-CM | POA: Insufficient documentation

## 2014-03-07 DIAGNOSIS — Z79899 Other long term (current) drug therapy: Secondary | ICD-10-CM | POA: Insufficient documentation

## 2014-03-07 DIAGNOSIS — Z23 Encounter for immunization: Secondary | ICD-10-CM | POA: Insufficient documentation

## 2014-03-07 MED ORDER — TETRACAINE HCL 0.5 % OP SOLN
2.0000 [drp] | Freq: Once | OPHTHALMIC | Status: DC
  Filled 2014-03-07: qty 2

## 2014-03-07 MED ORDER — FLUORESCEIN SODIUM 1 MG OP STRP
1.0000 | ORAL_STRIP | Freq: Once | OPHTHALMIC | Status: DC
  Filled 2014-03-07: qty 1

## 2014-03-07 MED ORDER — TETANUS-DIPHTH-ACELL PERTUSSIS 5-2.5-18.5 LF-MCG/0.5 IM SUSP
0.5000 mL | Freq: Once | INTRAMUSCULAR | Status: AC
  Administered 2014-03-07: 0.5 mL via INTRAMUSCULAR
  Filled 2014-03-07: qty 0.5

## 2014-03-07 MED ORDER — ERYTHROMYCIN 5 MG/GM OP OINT: TOPICAL_OINTMENT | Freq: Three times a day (TID) | OPHTHALMIC | Status: DC

## 2014-03-07 NOTE — ED Notes (Signed)
Pt was working on car, thinks he may have gotten something in left eye.

## 2014-03-07 NOTE — Discharge Instructions (Signed)
Eye, Foreign Body The term foreign body refers to any object near, on the surface of or in the eye that should not be there. A foreign body may be a small speck of dirt or dust, a hair or eyelash, a splinter or any object. CAUSES  Foreign bodies can get in the eye by:  Flying pieces of something that was broken or destroyed (debris).  A sudden injury (trauma) to the eye. SYMPTOMS  Symptoms depend on what the foreign body is and where it is in the eye. The most common locations are:  On the inner surface of the upper or lower eyelids or on the covering of the white part of the eye (conjunctiva). Symptoms in this location are:  Irritating and painful, especially when blinking.  Feeling like something is in the eye.  On the surface of the clear covering on the front of the eye (cornea). A corneal foreign body has symptoms that:  Are painful and irritating since the cornea is very sensitive.  Form small "rust rings" around a metallic foreign body. Metallic foreign bodies stick more firmly to the surface of the cornea.  Inside the eyeball. Infection can happen fast and can be hard to treat with antibiotics. This is an extremely dangerous situation. Foreign bodies inside the eye can threaten vision. A person may even loose their eye. Foreign bodies inside the eye may cause:  Great pain.  Immediate loss of vision. DIAGNOSIS  Foreign bodies are found during an exam by an eye specialist. Those that are on the eyelids, conjunctiva or cornea are usually (but not always) easily found. When a foreign body is inside the eyeball, a cataract may form almost right away. This makes it hard for an ophthalmologist to find the foreign body. Special tests may be needed, including ultrasound testing, X-rays and CT scans. TREATMENT   Foreign bodies that are on the eyelids, conjunctiva or cornea are often removed easily and painlessly.  If the foreign body has caused a scratch or abrasion of the cornea,  antibiotic drops, ointments and/or a tight patch called a "pressure patch" may be needed. Follow-up exams will be needed for several days until the abrasion heals.  Surgery is needed right away if the foreign body is inside the eyeball. This is a medical emergency. An antibiotic therapy will likely be given to stop an infection. HOME CARE INSTRUCTIONS  The use of eye patches is not universal. Their use varies from state to state and from caregiver to caregiver. If an eye patch was applied:  Keep the eye patch on for as long as directed by your caregiver until the follow-up appointment.  Do not remove the patch to put in medications unless instructed to do so. When replacing the patch, retape it as it was before. Follow the same procedure if the patch becomes loose.  WARNING: Do not drive or operate machinery while the eye is patched. The ability to judge distances will be impaired.  Only take over-the-counter or prescription medicines for pain, discomfort or fever as directed by the caregiver. If no eye patch was applied:  Keep the eye closed as much as possible. Do not rub the eye.  Wear dark glasses as needed to protect the eyes from bright light.  Do not wear contact lenses until the eye feels normal again, or as instructed.  Wear protective eye covering if there is a risk of eye injury. This is important when working with high speed tools.  Only take over-the-counter or   prescription medicines for pain, discomfort or fever as directed by the caregiver. SEEK IMMEDIATE MEDICAL CARE IF:   Pain increases in the eye or the vision changes.  You or your child has problems with the eye patch.  The injury to the eye appears to be getting larger.  There is discharge from the injured eye.  Swelling and/or soreness (inflammation) develops around the affected eye.  You or your child has an oral temperature above 102 F (38.9 C), not controlled by medicine.  Your baby is older than 3  months with a rectal temperature of 102 F (38.9 C) or higher.  Your baby is 3 months old or younger with a rectal temperature of 100.4 F (38 C) or higher. MAKE SURE YOU:   Understand these instructions.  Will watch your condition.  Will get help right away if you are not doing well or get worse. Document Released: 10/03/2005 Document Revised: 12/26/2011 Document Reviewed: 02/28/2013 [employer] Patient Information 2014 [employer].  

## 2014-03-07 NOTE — Progress Notes (Signed)
[facility] did not get to see patient but will be sending information about [facility] using address provided.
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
## 2014-03-07 NOTE — ED Provider Notes (Signed)
Medical screening examination/treatment/procedure(s) were performed by non-physician practitioner and as supervising physician I was immediately available for consultation/collaboration.  Wilberto Console L Tanairi Cypert, MD 03/07/14 1440 

## 2015-10-12 ENCOUNTER — Emergency Department (HOSPITAL_COMMUNITY)
Admission: EM | Admit: 2015-10-12 | Discharge: 2015-10-12 | Disposition: A | Discharge status: Home / Self Care | Payer: Self-pay | Attending: Emergency Medicine | Admitting: Emergency Medicine

## 2015-10-12 ENCOUNTER — Encounter (HOSPITAL_COMMUNITY): Payer: Self-pay | Admitting: *Deleted

## 2015-10-12 ENCOUNTER — Emergency Department (HOSPITAL_COMMUNITY): Payer: Self-pay

## 2015-10-12 DIAGNOSIS — Z79899 Other long term (current) drug therapy: Secondary | ICD-10-CM | POA: Insufficient documentation

## 2015-10-12 DIAGNOSIS — M79604 Pain in right leg: Secondary | ICD-10-CM

## 2015-10-12 DIAGNOSIS — Y9389 Activity, other specified: Secondary | ICD-10-CM | POA: Insufficient documentation

## 2015-10-12 DIAGNOSIS — S8011XA Contusion of right lower leg, initial encounter: Secondary | ICD-10-CM | POA: Insufficient documentation

## 2015-10-12 DIAGNOSIS — F17210 Nicotine dependence, cigarettes, uncomplicated: Secondary | ICD-10-CM | POA: Insufficient documentation

## 2015-10-12 DIAGNOSIS — Z7952 Long term (current) use of systemic steroids: Secondary | ICD-10-CM | POA: Insufficient documentation

## 2015-10-12 DIAGNOSIS — Y9289 Other specified places as the place of occurrence of the external cause: Secondary | ICD-10-CM | POA: Insufficient documentation

## 2015-10-12 DIAGNOSIS — Z87828 Personal history of other (healed) physical injury and trauma: Secondary | ICD-10-CM | POA: Insufficient documentation

## 2015-10-12 DIAGNOSIS — Y998 Other external cause status: Secondary | ICD-10-CM | POA: Insufficient documentation

## 2015-10-12 DIAGNOSIS — W01198A Fall on same level from slipping, tripping and stumbling with subsequent striking against other object, initial encounter: Secondary | ICD-10-CM | POA: Insufficient documentation

## 2015-10-12 NOTE — ED Notes (Signed)
Pt reports he fell on to cement steps Pt now has pain to anterior lower Rt leg. Pain of 10/10.

## 2015-10-12 NOTE — Discharge Instructions (Signed)

## 2015-10-12 NOTE — ED Notes (Signed)
Ice pack applied to right shin area

## 2015-10-12 NOTE — ED Notes (Signed)
Declined W/C at D/C and was escorted to lobby by RN. 
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
## 2016-03-10 ENCOUNTER — Encounter (HOSPITAL_COMMUNITY): Payer: Self-pay | Admitting: Emergency Medicine

## 2016-03-10 ENCOUNTER — Emergency Department (HOSPITAL_COMMUNITY)
Admission: EM | Admit: 2016-03-10 | Discharge: 2016-03-10 | Disposition: A | Discharge status: Home / Self Care | Payer: Self-pay | Attending: Dermatology | Admitting: Dermatology

## 2016-03-10 DIAGNOSIS — Z79899 Other long term (current) drug therapy: Secondary | ICD-10-CM | POA: Insufficient documentation

## 2016-03-10 DIAGNOSIS — F1420 Cocaine dependence, uncomplicated: Secondary | ICD-10-CM | POA: Insufficient documentation

## 2016-03-10 DIAGNOSIS — Z5321 Procedure and treatment not carried out due to patient leaving prior to being seen by health care provider: Secondary | ICD-10-CM | POA: Insufficient documentation

## 2016-03-10 DIAGNOSIS — F17210 Nicotine dependence, cigarettes, uncomplicated: Secondary | ICD-10-CM | POA: Insufficient documentation

## 2016-03-10 NOTE — ED Notes (Signed)
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

## 2016-10-23 IMAGING — DX DG TIBIA/FIBULA 2V*R*
4 series · 4 of 4 positions shown · non-contrast
Comparison: None

CLINICAL DATA: Fall

EXAM:
RIGHT TIBIA AND FIBULA - 2 VIEW

[tibia ap (1 of 2)]
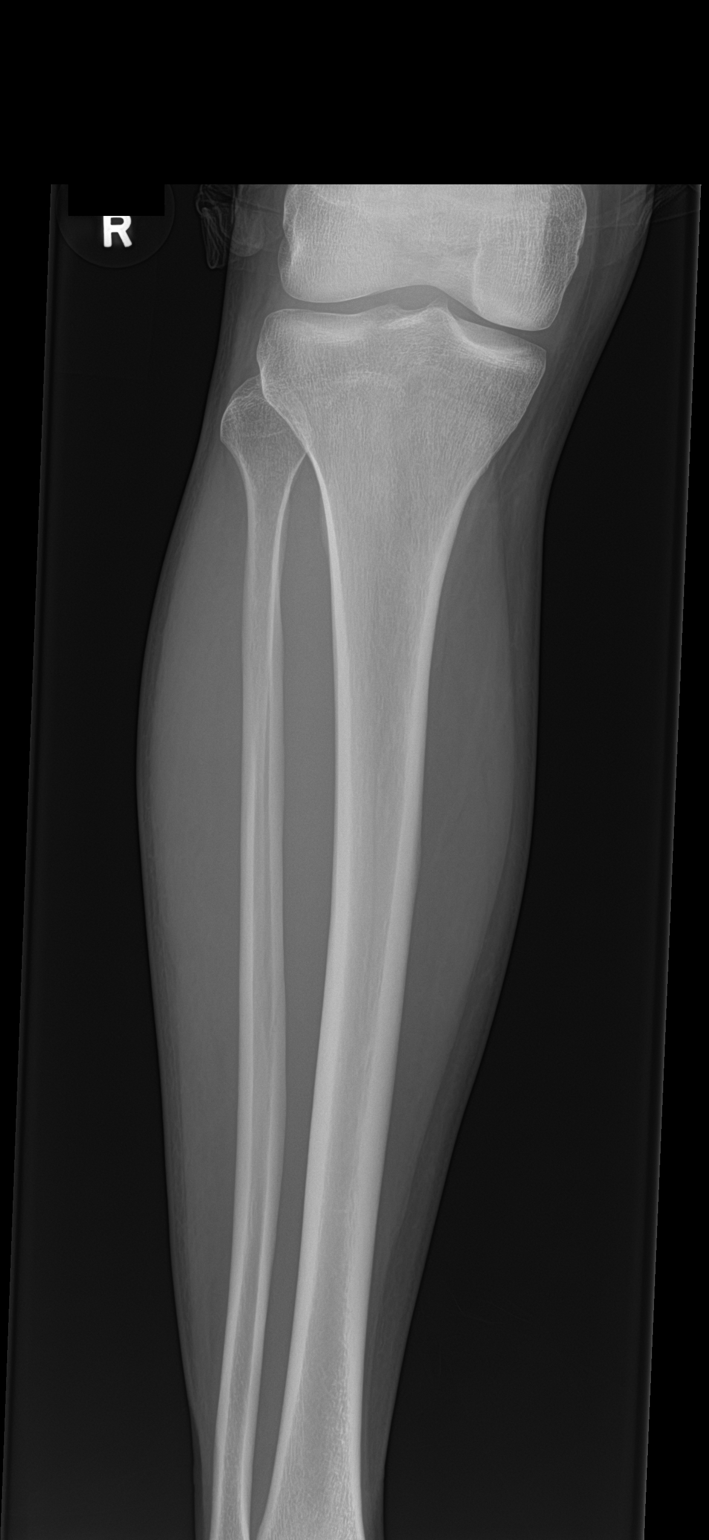

[tibia ap (2 of 2)]
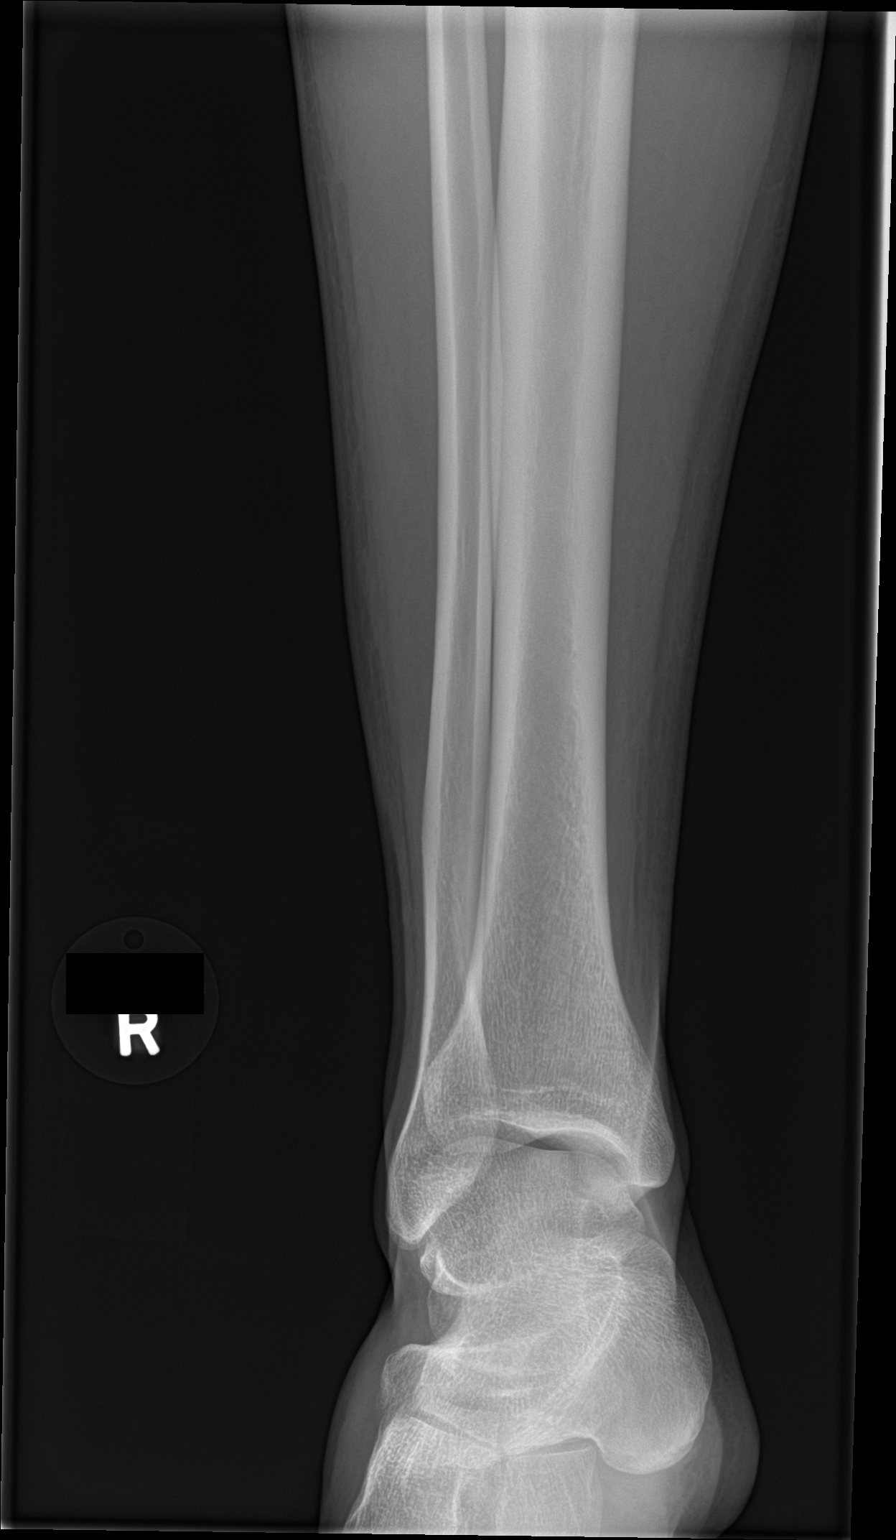

[tibia lat (1 of 2)]
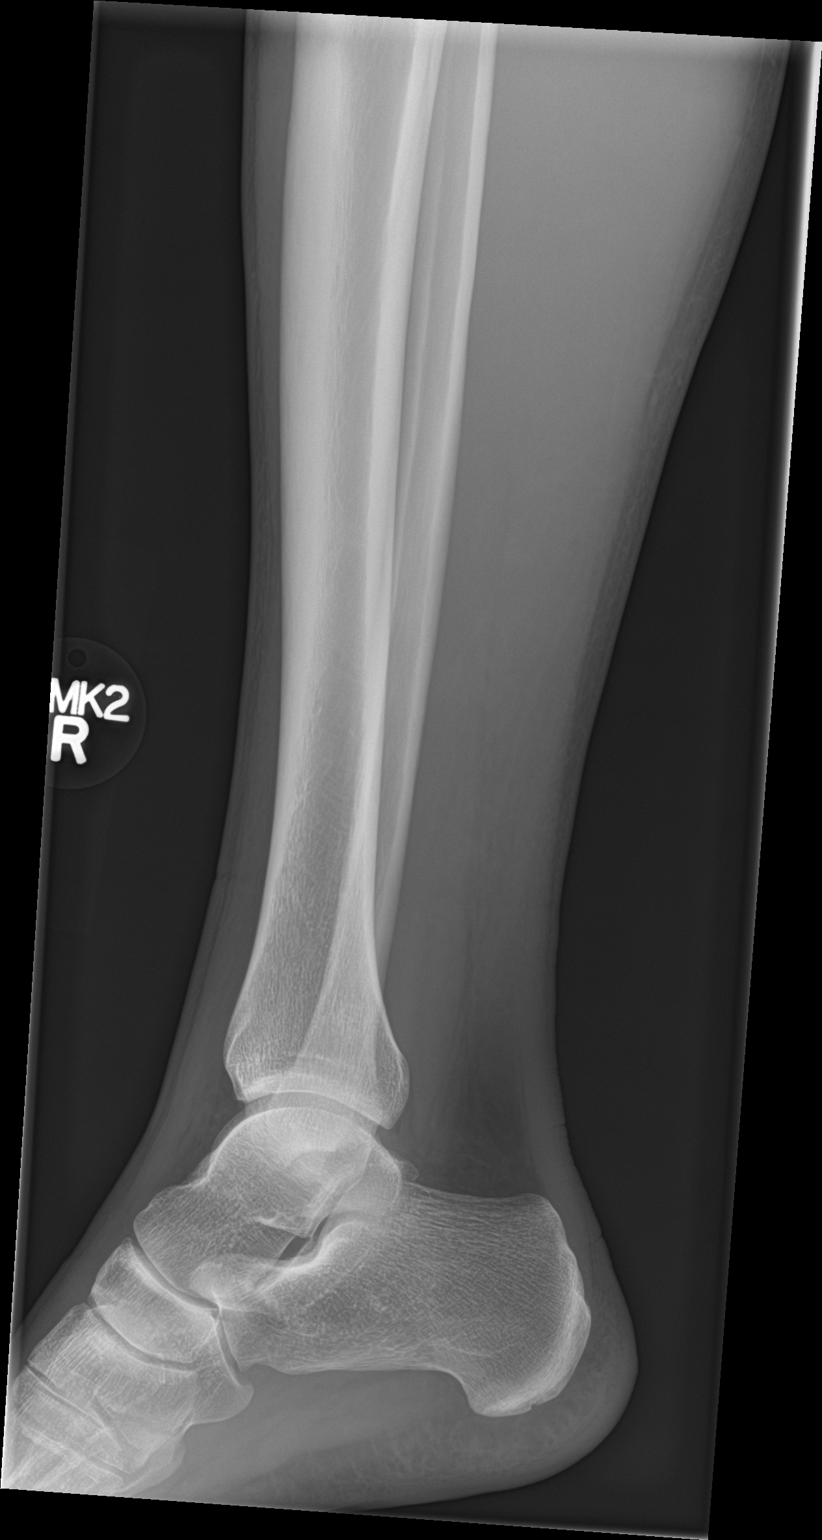

[tibia lat (2 of 2)]
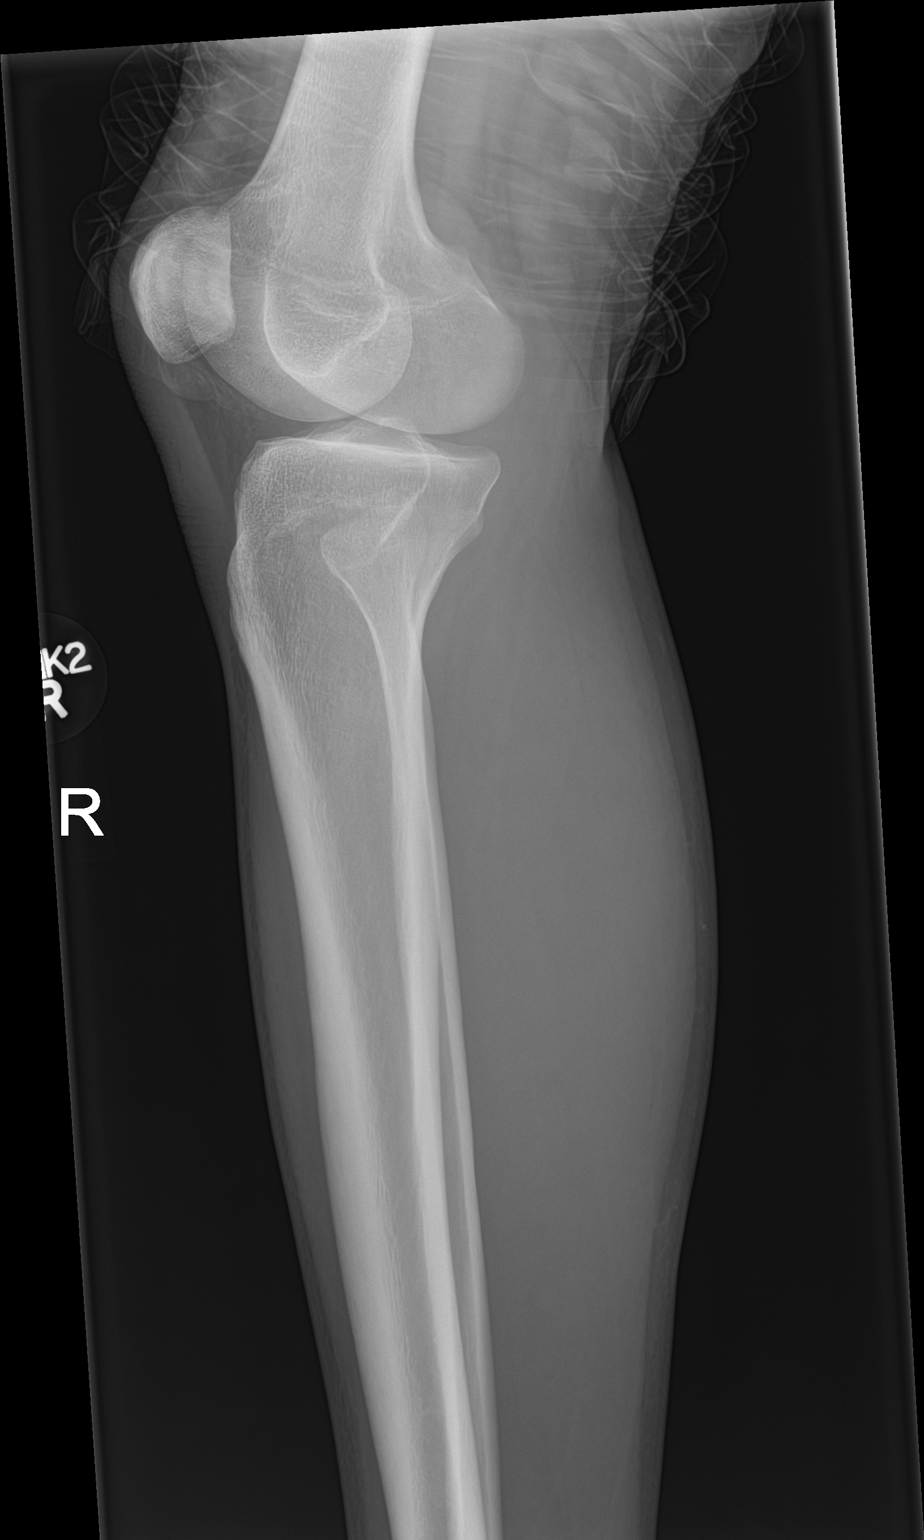

[4 of 4 positions shown; findings below may reference images not displayed]

FINDINGS: There is no evidence of fracture or other focal bone lesions. Soft
tissues are unremarkable.
IMPRESSION: Negative.
